# Patient Record
Sex: Female | Born: 1945
Health system: Southern US, Community
[De-identification: ages and names within clinical notes are randomized; demographics above are authoritative.]

## PROBLEM LIST (undated history)

## (undated) DIAGNOSIS — K219 Gastro-esophageal reflux disease without esophagitis: Secondary | ICD-10-CM

## (undated) DIAGNOSIS — E039 Hypothyroidism, unspecified: Secondary | ICD-10-CM

## (undated) DIAGNOSIS — E785 Hyperlipidemia, unspecified: Secondary | ICD-10-CM

## (undated) HISTORY — DX: Hyperlipidemia, unspecified: E78.5

## (undated) HISTORY — DX: Gastro-esophageal reflux disease without esophagitis: K21.9

---

## 1968-07-05 HISTORY — PX: BREAST LUMPECTOMY: SHX2

## 2000-09-04 ENCOUNTER — Encounter: Payer: Self-pay | Admitting: Emergency Medicine

## 2000-09-04 ENCOUNTER — Emergency Department (HOSPITAL_COMMUNITY): Admission: EM | Admit: 2000-09-04 | Discharge: 2000-09-04 | Payer: Self-pay | Admitting: Emergency Medicine

## 2011-05-18 ENCOUNTER — Other Ambulatory Visit (HOSPITAL_COMMUNITY): Payer: Self-pay | Admitting: Internal Medicine

## 2011-05-18 DIAGNOSIS — Z139 Encounter for screening, unspecified: Secondary | ICD-10-CM

## 2011-05-21 ENCOUNTER — Ambulatory Visit (HOSPITAL_COMMUNITY)
Admission: RE | Admit: 2011-05-21 | Discharge: 2011-05-21 | Disposition: A | Discharge status: Home / Self Care | Payer: Medicare Other | Source: Ambulatory Visit | Attending: Internal Medicine | Admitting: Internal Medicine

## 2011-05-21 DIAGNOSIS — Z139 Encounter for screening, unspecified: Secondary | ICD-10-CM

## 2011-05-21 DIAGNOSIS — Z780 Asymptomatic menopausal state: Secondary | ICD-10-CM | POA: Insufficient documentation

## 2011-05-21 DIAGNOSIS — Z13820 Encounter for screening for osteoporosis: Secondary | ICD-10-CM | POA: Insufficient documentation

## 2012-05-24 DIAGNOSIS — IMO0002 Reserved for concepts with insufficient information to code with codable children: Secondary | ICD-10-CM | POA: Diagnosis not present

## 2012-05-24 DIAGNOSIS — E039 Hypothyroidism, unspecified: Secondary | ICD-10-CM | POA: Diagnosis not present

## 2012-05-24 DIAGNOSIS — Z23 Encounter for immunization: Secondary | ICD-10-CM | POA: Diagnosis not present

## 2012-05-24 DIAGNOSIS — R7309 Other abnormal glucose: Secondary | ICD-10-CM | POA: Diagnosis not present

## 2013-05-23 DIAGNOSIS — Z23 Encounter for immunization: Secondary | ICD-10-CM | POA: Diagnosis not present

## 2013-05-23 DIAGNOSIS — IMO0002 Reserved for concepts with insufficient information to code with codable children: Secondary | ICD-10-CM | POA: Diagnosis not present

## 2013-05-23 DIAGNOSIS — E039 Hypothyroidism, unspecified: Secondary | ICD-10-CM | POA: Diagnosis not present

## 2013-05-23 DIAGNOSIS — M719 Bursopathy, unspecified: Secondary | ICD-10-CM | POA: Diagnosis not present

## 2013-05-23 DIAGNOSIS — M67919 Unspecified disorder of synovium and tendon, unspecified shoulder: Secondary | ICD-10-CM | POA: Diagnosis not present

## 2014-05-14 ENCOUNTER — Other Ambulatory Visit (HOSPITAL_COMMUNITY): Payer: Self-pay | Admitting: Family Medicine

## 2014-05-14 DIAGNOSIS — Z1231 Encounter for screening mammogram for malignant neoplasm of breast: Secondary | ICD-10-CM

## 2014-05-22 ENCOUNTER — Ambulatory Visit (HOSPITAL_COMMUNITY)
Admission: RE | Admit: 2014-05-22 | Discharge: 2014-05-22 | Disposition: A | Discharge status: Home / Self Care | Payer: Medicare Other | Source: Ambulatory Visit | Attending: Family Medicine | Admitting: Family Medicine

## 2014-05-22 DIAGNOSIS — Z1231 Encounter for screening mammogram for malignant neoplasm of breast: Secondary | ICD-10-CM | POA: Diagnosis not present

## 2014-05-28 DIAGNOSIS — Z681 Body mass index (BMI) 19 or less, adult: Secondary | ICD-10-CM | POA: Diagnosis not present

## 2014-05-28 DIAGNOSIS — E782 Mixed hyperlipidemia: Secondary | ICD-10-CM | POA: Diagnosis not present

## 2014-05-28 DIAGNOSIS — E039 Hypothyroidism, unspecified: Secondary | ICD-10-CM | POA: Diagnosis not present

## 2015-01-08 DIAGNOSIS — Z23 Encounter for immunization: Secondary | ICD-10-CM | POA: Diagnosis not present

## 2015-01-08 DIAGNOSIS — Z1389 Encounter for screening for other disorder: Secondary | ICD-10-CM | POA: Diagnosis not present

## 2015-01-08 DIAGNOSIS — E782 Mixed hyperlipidemia: Secondary | ICD-10-CM | POA: Diagnosis not present

## 2015-01-08 DIAGNOSIS — Z0000 Encounter for general adult medical examination without abnormal findings: Secondary | ICD-10-CM | POA: Diagnosis not present

## 2015-01-08 DIAGNOSIS — Z6822 Body mass index (BMI) 22.0-22.9, adult: Secondary | ICD-10-CM | POA: Diagnosis not present

## 2015-01-08 DIAGNOSIS — E039 Hypothyroidism, unspecified: Secondary | ICD-10-CM | POA: Diagnosis not present

## 2015-07-22 DIAGNOSIS — Z6823 Body mass index (BMI) 23.0-23.9, adult: Secondary | ICD-10-CM | POA: Diagnosis not present

## 2015-07-22 DIAGNOSIS — Z1389 Encounter for screening for other disorder: Secondary | ICD-10-CM | POA: Diagnosis not present

## 2015-07-22 DIAGNOSIS — E039 Hypothyroidism, unspecified: Secondary | ICD-10-CM | POA: Diagnosis not present

## 2015-09-16 ENCOUNTER — Telehealth: Payer: Self-pay

## 2015-09-16 NOTE — Telephone Encounter (Signed)
LMOM to call.

## 2015-09-16 NOTE — Telephone Encounter (Signed)
Pt received a triage letter from DS. Please call her at [phone] or [phone]

## 2015-09-17 ENCOUNTER — Telehealth: Payer: Self-pay

## 2015-09-17 NOTE — Telephone Encounter (Signed)
SUPREP SPLIT DOSING-REGULAR breakfast then CLEAR LIQUIDS after 9 am.   

## 2015-09-17 NOTE — Telephone Encounter (Signed)
See separate triage.  

## 2015-09-17 NOTE — Telephone Encounter (Signed)
Gastroenterology Pre-Procedure Review  Request Date: 09/17/2015 Requesting Physician: Dr. Ethlyn Gallery  PATIENT REVIEW QUESTIONS: The patient responded to the following health history questions as indicated:    This will be the first colonoscopy for pt  1. Diabetes Melitis: no 2. Joint replacements in the past 12 months: no 3. Major health problems in the past 3 months: no 4. Has an artificial valve or MVP: no 5. Has a defibrillator: no 6. Has been advised in past to take antibiotics in advance of a procedure like teeth cleaning: no 7. Family history of colon cancer: no  8. Alcohol Use: no 9. History of sleep apnea: no     MEDICATIONS & ALLERGIES:    Patient reports the following regarding taking any blood thinners:   Plavix? no Aspirin? no Coumadin? no  Patient confirms/reports the following medications:  Current Outpatient Prescriptions  Medication Sig Dispense Refill  . levothyroxine (SYNTHROID, LEVOTHROID) 100 MCG tablet Take 100 mcg by mouth daily before breakfast.     No current facility-administered medications for this visit.    Patient confirms/reports the following allergies:  Allergies  Allergen Reactions  . Percocet [Oxycodone-Acetaminophen] Rash    No orders of the defined types were placed in this encounter.    AUTHORIZATION INFORMATION Primary Insurance:   ID #:  Group #:  Pre-Cert / Auth required:  Pre-Cert / Auth #:   Secondary Insurance:   ID #:   Group #:  Pre-Cert / Auth required:  Pre-Cert / Auth #:   SCHEDULE INFORMATION: Procedure has been scheduled as follows:  Date: 10/24/2015               Time: 10:30 Am Location: [facility] Short Stay  This Gastroenterology Pre-Precedure Review Form is being routed to the following provider(s): Barney Drain, MD

## 2015-09-17 NOTE — Telephone Encounter (Signed)
Pt was returning DS call from yesterday to be triaged. She can be reached at [phone] ([place])

## 2015-09-18 ENCOUNTER — Other Ambulatory Visit: Payer: Self-pay

## 2015-09-18 DIAGNOSIS — Z1211 Encounter for screening for malignant neoplasm of colon: Secondary | ICD-10-CM

## 2015-09-18 MED ORDER — NA SULFATE-K SULFATE-MG SULF 17.5-3.13-1.6 GM/177ML PO SOLN: 1.0000 | ORAL | Status: DC

## 2015-09-18 NOTE — Addendum Note (Signed)
Addended by: Everardo All on: 09/18/2015 09:59 AM   Modules accepted: Orders

## 2015-09-18 NOTE — Telephone Encounter (Signed)
Rx sent to the pharmacy and instructions mailed to pt.  

## 2015-10-20 ENCOUNTER — Telehealth: Payer: Self-pay

## 2015-10-20 NOTE — Telephone Encounter (Signed)
Called pt to update med list prior to colonoscopy and she has not had any change in her meds since she was triaged.

## 2015-10-21 NOTE — Telephone Encounter (Signed)
REVIEWED-NO ADDITIONAL RECOMMENDATIONS. 

## 2015-10-24 ENCOUNTER — Encounter (HOSPITAL_COMMUNITY): Payer: Self-pay | Admitting: *Deleted

## 2015-10-24 ENCOUNTER — Ambulatory Visit (HOSPITAL_COMMUNITY)
Admission: RE | Admit: 2015-10-24 | Discharge: 2015-10-24 | Disposition: A | Discharge status: Home / Self Care | Payer: Medicare Other | Source: Ambulatory Visit | Attending: Gastroenterology | Admitting: Gastroenterology

## 2015-10-24 ENCOUNTER — Encounter (HOSPITAL_COMMUNITY)
Admission: RE | Disposition: A | Discharge status: Home / Self Care | Payer: Self-pay | Source: Ambulatory Visit | Attending: Gastroenterology

## 2015-10-24 DIAGNOSIS — D123 Benign neoplasm of transverse colon: Secondary | ICD-10-CM | POA: Diagnosis not present

## 2015-10-24 DIAGNOSIS — Q438 Other specified congenital malformations of intestine: Secondary | ICD-10-CM | POA: Insufficient documentation

## 2015-10-24 DIAGNOSIS — K5730 Diverticulosis of large intestine without perforation or abscess without bleeding: Secondary | ICD-10-CM | POA: Insufficient documentation

## 2015-10-24 DIAGNOSIS — Z79899 Other long term (current) drug therapy: Secondary | ICD-10-CM | POA: Diagnosis not present

## 2015-10-24 DIAGNOSIS — E039 Hypothyroidism, unspecified: Secondary | ICD-10-CM | POA: Diagnosis not present

## 2015-10-24 DIAGNOSIS — Z87891 Personal history of nicotine dependence: Secondary | ICD-10-CM | POA: Insufficient documentation

## 2015-10-24 DIAGNOSIS — Z1211 Encounter for screening for malignant neoplasm of colon: Secondary | ICD-10-CM

## 2015-10-24 DIAGNOSIS — K648 Other hemorrhoids: Secondary | ICD-10-CM | POA: Insufficient documentation

## 2015-10-24 HISTORY — DX: Hypothyroidism, unspecified: E03.9

## 2015-10-24 HISTORY — PX: COLONOSCOPY: SHX5424

## 2015-10-24 SURGERY — COLONOSCOPY
Anesthesia: Moderate Sedation

## 2015-10-24 MED ORDER — MIDAZOLAM HCL 5 MG/5ML IJ SOLN
INTRAMUSCULAR | Status: DC | PRN
  Administered 2015-10-24: 2 mg via INTRAVENOUS
  Administered 2015-10-24: 1 mg via INTRAVENOUS

## 2015-10-24 MED ORDER — MEPERIDINE HCL 100 MG/ML IJ SOLN
INTRAMUSCULAR | Status: AC
  Filled 2015-10-24: qty 1

## 2015-10-24 MED ORDER — SODIUM CHLORIDE 0.9 % IV SOLN
INTRAVENOUS | Status: DC
  Administered 2015-10-24: 1000 mL via INTRAVENOUS

## 2015-10-24 MED ORDER — MEPERIDINE HCL 100 MG/ML IJ SOLN
INTRAMUSCULAR | Status: DC | PRN
  Administered 2015-10-24: 25 mg via INTRAVENOUS

## 2015-10-24 MED ORDER — STERILE WATER FOR IRRIGATION IR SOLN
Status: DC | PRN
  Administered 2015-10-24: 09:00:00

## 2015-10-24 MED ORDER — MIDAZOLAM HCL 5 MG/5ML IJ SOLN
INTRAMUSCULAR | Status: AC
  Filled 2015-10-24: qty 5

## 2015-10-24 NOTE — H&P (Signed)
  Primary Care Physician:  Rocky Morel, MD Primary Gastroenterologist:  Dr. Oneida Alar  Pre-Procedure History & Physical: HPI:  Ashley Meza is a 70 y.o. female here for [employer].  Past Medical History  Diagnosis Date  . Hypothyroidism     Past Surgical History  Procedure Laterality Date  . Breast lumpectomy Left 1970    Prior to Admission medications   Medication Sig Start Date End Date Taking? Authorizing Provider  levothyroxine (SYNTHROID, LEVOTHROID) 100 MCG tablet Take 100 mcg by mouth daily before breakfast.   Yes Historical Provider, MD  Na Sulfate-K Sulfate-Mg Sulf (SUPREP BOWEL PREP) SOLN Take 1 kit by mouth as directed. 09/18/15  Yes Danie Binder, MD    Allergies as of 09/18/2015 - Review Complete 09/17/2015  Allergen Reaction Noted  . Percocet [oxycodone-acetaminophen] Rash 09/17/2015    Family History  Problem Relation Age of Onset  . Hypertension Mother     Social History   Social History  . Marital Status: Married    Spouse Name: N/A  . Number of Children: N/A  . Years of Education: N/A   Occupational History  . Not on file.   Social History Main Topics  . Smoking status: Former Smoker    Types: Cigarettes  . Smokeless tobacco: Not on file  . Alcohol Use: Yes     Comment: glass of wine every 6 months  . Drug Use: No  . Sexual Activity: Not on file   Other Topics Concern  . Not on file   Social History Narrative  . No narrative on file    Review of Systems: See HPI, otherwise negative ROS   Physical Exam: BP 117/68 mmHg  Pulse 65  Temp(Src) 97.9 F (36.6 C) (Oral)  Resp 12  Ht '5\' 5"'$  (1.651 m)  Wt 135 lb (61.236 kg)  BMI 22.47 kg/m2  SpO2 99% General:   Alert,  pleasant and cooperative in NAD Head:  Normocephalic and atraumatic. Neck:  Supple; Lungs:  Clear throughout to auscultation.    Heart:  Regular rate and rhythm. Abdomen:  Soft, nontender and nondistended. Normal bowel sounds, without guarding,  and without rebound.   Neurologic:  Alert and  oriented x4;  grossly normal neurologically.  Impression/Plan:    SCREENING  Plan:  1. TCS TODAY

## 2015-10-24 NOTE — Op Note (Signed)
[facility] Patient Name: Ashley Meza Procedure Date: 10/24/2015 7:47 AM MRN: [id number] Date of Birth: 1946-03-04 Attending MD: Barney Drain , MD CSN: [id number] Age: 70 Admit Type: Outpatient Procedure:                Colonoscopy Indications:              Screening for colorectal malignant neoplasm Providers:                Barney Drain, MD, Gwenlyn Fudge, RN, Cleda Clarks, Technologist Referring MD:             Micheline Rough, MD Medicines:                Meperidine 25 mg IV, Midazolam 3 mg IV Complications:            No immediate complications. Estimated Blood Loss:     Estimated blood loss was minimal. Procedure:                Pre-Anesthesia Assessment:                           - Prior to the procedure, a History and Physical                            was performed, and patient medications and                            allergies were reviewed. The patient's tolerance of                            previous anesthesia was also reviewed. The risks                            and benefits of the procedure and the sedation                            options and risks were discussed with the patient.                            All questions were answered, and informed consent                            was obtained. Prior Anticoagulants: The patient has                            taken no previous anticoagulant or antiplatelet                            agents. ASA Grade Assessment: I - A normal, healthy                            patient. After reviewing the risks and benefits,  the patient was deemed in satisfactory condition to                            undergo the procedure.                           After obtaining informed consent, the colonoscope                            was passed under direct vision. Throughout the                            procedure, the patient's blood pressure, pulse, and                  oxygen saturations were monitored continuously. The                            [id number] scope was introduced through                            the anus and advanced to the the cecum, identified                            by appendiceal orifice and ileocecal valve. The                            ileocecal valve, appendiceal orifice, and rectum                            were photographed. The colonoscopy was performed                            without difficulty. The patient tolerated the                            procedure well. The quality of the bowel                            preparation was excellent. Scope In: 8:58:17 AM Scope Out: 9:19:54 AM Scope Withdrawal Time: 0 hours 10 minutes 55 seconds  Total Procedure Duration: 0 hours 21 minutes 37 seconds  Findings:      The digital rectal exam was normal.      The sigmoid colon was mildly tortuous.      Many large-mouthed diverticula were found in the sigmoid colon,       transverse colon and ascending colon.      A 3 mm polyp was found in the mid transverse colon. The polyp was       sessile. The polyp was removed with a cold biopsy forceps. Resection and       retrieval were complete.      Non-bleeding internal hemorrhoids were found. The hemorrhoids were       moderate. Impression:               - Tortuous colon.                           -  Diverticulosis in the sigmoid colon, in the                            transverse colon and in the ascending colon.                           - One 3 mm polyp in the mid transverse colon,                            removed with a cold biopsy forceps. Resected and                            retrieved.                           - Non-bleeding internal hemorrhoids. Moderate Sedation:      Moderate (conscious) sedation was administered by the endoscopy nurse       and supervised by the endoscopist. The following parameters were       monitored: oxygen saturation,  heart rate, blood pressure, and response       to care. Total physician intraservice time was 30 minutes. Recommendation:           - Patient has a contact number available for                            emergencies. The signs and symptoms of potential                            delayed complications were discussed with the                            patient. Return to normal activities tomorrow.                            Written discharge instructions were provided to the                            patient.                           - High fiber diet.                           - Continue present medications.                           - Await pathology results.                           - Repeat colonoscopy in 5-10 years for surveillance                            if the benefits outweigh the risks. Procedure Code(s):        --- Professional ---  [id number], Colonoscopy, flexible; with biopsy, single                            or multiple                           99153, Moderate sedation services; each additional                            15 minutes intraservice time                           G0500, Moderate sedation services provided by the                            same physician or other qualified health care                            professional performing a gastrointestinal                            endoscopic service that sedation supports,                            requiring the presence of an independent trained                            observer to assist in the monitoring of the                            patient's level of consciousness and physiological                            status; initial 15 minutes of intra-service time;                            patient age 5 years or older (additional time may                            be reported with [phone], as appropriate) Diagnosis Code(s):        --- Professional ---                            Z12.11, Encounter for screening for malignant                            neoplasm of colon                           K64.8, Other hemorrhoids                           D12.3, Benign neoplasm of transverse colon (hepatic                            flexure or splenic  flexure)                           K57.30, Diverticulosis of large intestine without                            perforation or abscess without bleeding                           Q43.8, Other specified congenital malformations of                            intestine CPT copyright 2016 [employer]. All rights reserved. The codes documented in this report are preliminary and upon coder review may  be revised to meet current compliance requirements. Barney Drain, MD Barney Drain, MD 10/24/2015 9:52:00 AM This report has been signed electronically. Number of Addenda: 0

## 2015-10-24 NOTE — Discharge Instructions (Signed)
You have MODERATE internal hemorrhoids and diverticulosis IN YOUR LEFT AND RIGHT COLON. YOU HAD ONE SMALL POLYP REMOVED FROM YOUR TRANSVERSE(MIDDLE) COLON.   DRINK WATER TO KEEP YOUR URINE LIGHT YELLOW.  FOLLOW A HIGH FIBER DIET. AVOID ITEMS THAT CAUSE BLOATING. See info below.  YOUR BIOPSY RESULTS WILL BE AVAILABLE IN MY CHART  APR 24 AND  OR MY OFFICE WILL CONTACT YOU IN 10-14 DAYS WITH YOUR RESULTS.   Next colonoscopy in 5-10 years.  Colonoscopy Care After Read the instructions outlined below and refer to this sheet in the next week. These discharge instructions provide you with general information on caring for yourself after you leave the hospital. While your treatment has been planned according to the most current medical practices available, unavoidable complications occasionally occur. If you have any problems or questions after discharge, call DR. Dalyn Kjos, [phone].  ACTIVITY  You may resume your regular activity, but move at a slower pace for the next 24 hours.   Take frequent rest periods for the next 24 hours.   Walking will help get rid of the air and reduce the bloated feeling in your belly (abdomen).   No driving for 24 hours (because of the medicine (anesthesia) used during the test).   You may shower.   Do not sign any important legal documents or operate any machinery for 24 hours (because of the anesthesia used during the test).    NUTRITION  Drink plenty of fluids.   You may resume your normal diet as instructed by your doctor.   Begin with a light meal and progress to your normal diet. Heavy or fried foods are harder to digest and may make you feel sick to your stomach (nauseated).   Avoid alcoholic beverages for 24 hours or as instructed.    MEDICATIONS  You may resume your normal medications.   WHAT YOU CAN EXPECT TODAY  Some feelings of bloating in the abdomen.   Passage of more gas than usual.   Spotting of blood in your stool or on the  toilet paper  .  IF YOU HAD POLYPS REMOVED DURING THE COLONOSCOPY:  Eat a soft diet IF YOU HAVE NAUSEA, BLOATING, ABDOMINAL PAIN, OR VOMITING.    FINDING OUT THE RESULTS OF YOUR TEST Not all test results are available during your visit. DR. Oneida Alar WILL CALL YOU WITHIN 14 DAYS OF YOUR PROCEDUE WITH YOUR RESULTS. Do not assume everything is normal if you have not heard from DR. Chimamanda Siegfried, CALL HER OFFICE AT [phone].  SEEK IMMEDIATE MEDICAL ATTENTION AND CALL THE OFFICE: [phone] IF:  You have more than a spotting of blood in your stool.   Your belly is swollen (abdominal distention).   You are nauseated or vomiting.   You have a temperature over 101F.   You have abdominal pain or discomfort that is severe or gets worse throughout the day.  High-Fiber Diet A high-fiber diet changes your normal diet to include more whole grains, legumes, fruits, and vegetables. Changes in the diet involve replacing refined carbohydrates with unrefined foods. The calorie level of the diet is essentially unchanged. The Dietary Reference Intake (recommended amount) for adult males is 38 grams per day. For adult females, it is 25 grams per day. Pregnant and lactating women should consume 28 grams of fiber per day. Fiber is the intact part of a plant that is not broken down during digestion. Functional fiber is fiber that has been isolated from the plant to provide a beneficial effect in  the body. PURPOSE  Increase stool bulk.   Ease and regulate bowel movements.   Lower cholesterol.  REDUCE RISK OF COLON CANCER  INDICATIONS THAT YOU NEED MORE FIBER  Constipation and hemorrhoids.   Uncomplicated diverticulosis (intestine condition) and irritable bowel syndrome.   Weight management.   As a protective measure against hardening of the arteries (atherosclerosis), diabetes, and cancer.   GUIDELINES FOR INCREASING FIBER IN THE DIET  Start adding fiber to the diet slowly. A gradual increase of  about 5 more grams (2 slices of whole-wheat bread, 2 servings of most fruits or vegetables, or 1 bowl of high-fiber cereal) per day is best. Too rapid an increase in fiber may result in constipation, flatulence, and bloating.   Drink enough water and fluids to keep your urine clear or pale yellow. Water, juice, or caffeine-free drinks are recommended. Not drinking enough fluid may cause constipation.   Eat a variety of high-fiber foods rather than one type of fiber.   Try to increase your intake of fiber through using high-fiber foods rather than fiber pills or supplements that contain small amounts of fiber.   The goal is to change the types of food eaten. Do not supplement your present diet with high-fiber foods, but replace foods in your present diet.   INCLUDE A VARIETY OF FIBER SOURCES  Replace refined and processed grains with whole grains, canned fruits with fresh fruits, and incorporate other fiber sources. White rice, white breads, and most bakery goods contain little or no fiber.   Brown whole-grain rice, buckwheat oats, and many fruits and vegetables are all good sources of fiber. These include: broccoli, Brussels sprouts, cabbage, cauliflower, beets, sweet potatoes, white potatoes (skin on), carrots, tomatoes, eggplant, squash, berries, fresh fruits, and dried fruits.   Cereals appear to be the richest source of fiber. Cereal fiber is found in whole grains and bran. Bran is the fiber-rich outer coat of cereal grain, which is largely removed in refining. In whole-grain cereals, the bran remains. In breakfast cereals, the largest amount of fiber is found in those with "bran" in their names. The fiber content is sometimes indicated on the label.   You may need to include additional fruits and vegetables each day.   In baking, for 1 cup white flour, you may use the following substitutions:   1 cup whole-wheat flour minus 2 tablespoons.   1/2 cup white flour plus 1/2 cup whole-wheat  flour.   Polyps, Colon  A polyp is extra tissue that grows inside your body. Colon polyps grow in the large intestine. The large intestine, also called the colon, is part of your digestive system. It is a long, hollow tube at the end of your digestive tract where your body makes and stores stool. Most polyps are not dangerous. They are benign. This means they are not cancerous. But over time, some types of polyps can turn into cancer. Polyps that are smaller than a pea are usually not harmful. But larger polyps could someday become or may already be cancerous. To be safe, doctors remove all polyps and test them.   PREVENTION There is not one sure way to prevent polyps. You might be able to lower your risk of getting them if you:  Eat more fruits and vegetables and less fatty food.   Do not smoke.   Avoid alcohol.   Exercise every day.   Lose weight if you are overweight.   Eating more calcium and folate can also lower your risk  of getting polyps. Some foods that are rich in calcium are milk, cheese, and broccoli. Some foods that are rich in folate are chickpeas, kidney beans, and spinach.    Diverticulosis Diverticulosis is a common condition that develops when small pouches (diverticula) form in the wall of the colon. The risk of diverticulosis increases with age. It happens more often in people who eat a low-fiber diet. Most individuals with diverticulosis have no symptoms. Those individuals with symptoms usually experience belly (abdominal) pain, constipation, or loose stools (diarrhea).  HOME CARE INSTRUCTIONS  Increase the amount of fiber in your diet as directed by your caregiver or dietician. This may reduce symptoms of diverticulosis.   Drink at least 6 to 8 glasses of water each day to prevent constipation.   Try not to strain when you have a bowel movement.   Avoiding nuts and seeds to prevent complications is NOT NECESSARY.   FOODS HAVING HIGH FIBER CONTENT  INCLUDE:  Fruits. Apple, peach, pear, tangerine, raisins, prunes.   Vegetables. Brussels sprouts, asparagus, broccoli, cabbage, carrot, cauliflower, romaine lettuce, spinach, summer squash, tomato, winter squash, zucchini.   Starchy Vegetables. Baked beans, kidney beans, lima beans, split peas, lentils, potatoes (with skin).   Grains. Whole wheat bread, brown rice, bran flake cereal, plain oatmeal, white rice, shredded wheat, bran muffins.   SEEK IMMEDIATE MEDICAL CARE IF:  You develop increasing pain or severe bloating.   You have an oral temperature above 101F.   You develop vomiting or bowel movements that are bloody or black.   Hemorrhoids Hemorrhoids are dilated (enlarged) veins around the rectum. Sometimes clots will form in the veins. This makes them swollen and painful. These are called thrombosed hemorrhoids. Causes of hemorrhoids include:  Constipation.   Straining to have a bowel movement.   HEAVY LIFTING  HOME CARE INSTRUCTIONS  Eat a well balanced diet and drink 6 to 8 glasses of water every day to avoid constipation. You may also use a bulk laxative.   Avoid straining to have bowel movements.   Keep anal area dry and clean.   Do not use a donut shaped pillow or sit on the toilet for long periods. This increases blood pooling and pain.   Move your bowels when your body has the urge; this will require less straining and will decrease pain and pressure.

## 2015-10-28 ENCOUNTER — Encounter (HOSPITAL_COMMUNITY): Payer: Self-pay | Admitting: Gastroenterology

## 2015-10-31 ENCOUNTER — Telehealth: Payer: Self-pay | Admitting: Gastroenterology

## 2015-10-31 NOTE — Telephone Encounter (Signed)
Please call pt. She had a simple adenoma removed from her colon.    DRINK WATER TO KEEP YOUR URINE LIGHT YELLOW.  FOLLOW A HIGH FIBER DIET. AVOID ITEMS THAT CAUSE BLOATING.  Next colonoscopy in 5-10 years.

## 2015-11-03 ENCOUNTER — Telehealth: Payer: Self-pay | Admitting: Gastroenterology

## 2015-11-03 NOTE — Telephone Encounter (Signed)
See separate phone call.

## 2015-11-03 NOTE — Telephone Encounter (Signed)
Pt called to say that she was returning a call regarding her results, but she also said she had reviewed her results on mychart and if there was anything else the nurse needed to speak to her about she could call.

## 2015-11-03 NOTE — Telephone Encounter (Signed)
LMOM that if she saw the results from her procedure that I do not need to call her, but if she has questions for her to call me.

## 2015-11-03 NOTE — Telephone Encounter (Signed)
Reminder in epic °

## 2015-11-03 NOTE — Telephone Encounter (Signed)
Left message for pt to call.

## 2016-05-07 DIAGNOSIS — Z23 Encounter for immunization: Secondary | ICD-10-CM | POA: Diagnosis not present

## 2016-08-04 DIAGNOSIS — Z0001 Encounter for general adult medical examination with abnormal findings: Secondary | ICD-10-CM | POA: Diagnosis not present

## 2016-08-04 DIAGNOSIS — Z0000 Encounter for general adult medical examination without abnormal findings: Secondary | ICD-10-CM | POA: Diagnosis not present

## 2016-08-04 DIAGNOSIS — E039 Hypothyroidism, unspecified: Secondary | ICD-10-CM | POA: Diagnosis not present

## 2016-08-04 DIAGNOSIS — Z1389 Encounter for screening for other disorder: Secondary | ICD-10-CM | POA: Diagnosis not present

## 2016-08-04 DIAGNOSIS — E782 Mixed hyperlipidemia: Secondary | ICD-10-CM | POA: Diagnosis not present

## 2016-08-04 DIAGNOSIS — Z6823 Body mass index (BMI) 23.0-23.9, adult: Secondary | ICD-10-CM | POA: Diagnosis not present

## 2017-06-24 DIAGNOSIS — Z23 Encounter for immunization: Secondary | ICD-10-CM | POA: Diagnosis not present

## 2017-09-30 DIAGNOSIS — R739 Hyperglycemia, unspecified: Secondary | ICD-10-CM | POA: Diagnosis not present

## 2017-09-30 DIAGNOSIS — E7849 Other hyperlipidemia: Secondary | ICD-10-CM | POA: Diagnosis not present

## 2017-09-30 DIAGNOSIS — Z1389 Encounter for screening for other disorder: Secondary | ICD-10-CM | POA: Diagnosis not present

## 2017-09-30 DIAGNOSIS — R7309 Other abnormal glucose: Secondary | ICD-10-CM | POA: Diagnosis not present

## 2017-09-30 DIAGNOSIS — Z0001 Encounter for general adult medical examination with abnormal findings: Secondary | ICD-10-CM | POA: Diagnosis not present

## 2018-06-03 DIAGNOSIS — Z23 Encounter for immunization: Secondary | ICD-10-CM | POA: Diagnosis not present

## 2018-11-21 DIAGNOSIS — Z0000 Encounter for general adult medical examination without abnormal findings: Secondary | ICD-10-CM | POA: Diagnosis not present

## 2018-11-21 DIAGNOSIS — Z681 Body mass index (BMI) 19 or less, adult: Secondary | ICD-10-CM | POA: Diagnosis not present

## 2018-11-21 DIAGNOSIS — Z1389 Encounter for screening for other disorder: Secondary | ICD-10-CM | POA: Diagnosis not present

## 2019-01-01 ENCOUNTER — Other Ambulatory Visit (HOSPITAL_COMMUNITY): Payer: Self-pay | Admitting: Internal Medicine

## 2019-01-01 DIAGNOSIS — Z1231 Encounter for screening mammogram for malignant neoplasm of breast: Secondary | ICD-10-CM

## 2019-01-10 ENCOUNTER — Other Ambulatory Visit: Payer: Self-pay

## 2019-01-10 ENCOUNTER — Ambulatory Visit (HOSPITAL_COMMUNITY)
Admission: RE | Admit: 2019-01-10 | Discharge: 2019-01-10 | Disposition: A | Discharge status: Home / Self Care | Payer: Medicare Other | Source: Ambulatory Visit | Attending: Internal Medicine | Admitting: Internal Medicine

## 2019-01-10 DIAGNOSIS — Z1231 Encounter for screening mammogram for malignant neoplasm of breast: Secondary | ICD-10-CM | POA: Diagnosis not present

## 2019-01-15 ENCOUNTER — Other Ambulatory Visit (HOSPITAL_COMMUNITY): Payer: Self-pay | Admitting: Internal Medicine

## 2019-01-15 ENCOUNTER — Other Ambulatory Visit: Payer: Self-pay | Admitting: *Deleted

## 2019-01-15 DIAGNOSIS — Z20822 Contact with and (suspected) exposure to covid-19: Secondary | ICD-10-CM

## 2019-01-15 DIAGNOSIS — R928 Other abnormal and inconclusive findings on diagnostic imaging of breast: Secondary | ICD-10-CM

## 2019-01-18 DIAGNOSIS — E063 Autoimmune thyroiditis: Secondary | ICD-10-CM | POA: Diagnosis not present

## 2019-01-18 DIAGNOSIS — Z6821 Body mass index (BMI) 21.0-21.9, adult: Secondary | ICD-10-CM | POA: Diagnosis not present

## 2019-01-18 DIAGNOSIS — R0789 Other chest pain: Secondary | ICD-10-CM | POA: Diagnosis not present

## 2019-01-19 LAB — NOVEL CORONAVIRUS, NAA: SARS-CoV-2, NAA: NOT DETECTED

## 2019-01-23 ENCOUNTER — Ambulatory Visit (HOSPITAL_COMMUNITY)
Admission: RE | Admit: 2019-01-23 | Discharge: 2019-01-23 | Disposition: A | Discharge status: Home / Self Care | Payer: Medicare Other | Source: Ambulatory Visit | Attending: Internal Medicine | Admitting: Internal Medicine

## 2019-01-23 ENCOUNTER — Encounter (HOSPITAL_COMMUNITY): Payer: Self-pay

## 2019-01-23 ENCOUNTER — Other Ambulatory Visit: Payer: Self-pay

## 2019-01-23 DIAGNOSIS — R928 Other abnormal and inconclusive findings on diagnostic imaging of breast: Secondary | ICD-10-CM

## 2019-01-23 DIAGNOSIS — N6001 Solitary cyst of right breast: Secondary | ICD-10-CM | POA: Diagnosis not present

## 2019-03-30 ENCOUNTER — Ambulatory Visit (HOSPITAL_COMMUNITY)
Admission: RE | Admit: 2019-03-30 | Discharge: 2019-03-30 | Disposition: A | Discharge status: Home / Self Care | Payer: Medicare Other | Source: Ambulatory Visit | Attending: Family Medicine | Admitting: Family Medicine

## 2019-03-30 ENCOUNTER — Other Ambulatory Visit (HOSPITAL_COMMUNITY): Payer: Self-pay | Admitting: Family Medicine

## 2019-03-30 DIAGNOSIS — R0789 Other chest pain: Secondary | ICD-10-CM

## 2019-04-06 DIAGNOSIS — R05 Cough: Secondary | ICD-10-CM | POA: Diagnosis not present

## 2019-04-06 DIAGNOSIS — Z23 Encounter for immunization: Secondary | ICD-10-CM | POA: Diagnosis not present

## 2019-04-06 DIAGNOSIS — Z6821 Body mass index (BMI) 21.0-21.9, adult: Secondary | ICD-10-CM | POA: Diagnosis not present

## 2019-04-09 ENCOUNTER — Encounter: Payer: Self-pay | Admitting: Gastroenterology

## 2019-04-26 ENCOUNTER — Encounter: Payer: Self-pay | Admitting: *Deleted

## 2019-04-26 ENCOUNTER — Encounter: Payer: Self-pay | Admitting: Nurse Practitioner

## 2019-04-26 ENCOUNTER — Ambulatory Visit (INDEPENDENT_AMBULATORY_CARE_PROVIDER_SITE_OTHER): Payer: Medicare Other | Admitting: Nurse Practitioner

## 2019-04-26 ENCOUNTER — Other Ambulatory Visit: Payer: Self-pay

## 2019-04-26 ENCOUNTER — Other Ambulatory Visit: Payer: Self-pay | Admitting: *Deleted

## 2019-04-26 DIAGNOSIS — K219 Gastro-esophageal reflux disease without esophagitis: Secondary | ICD-10-CM | POA: Diagnosis not present

## 2019-04-26 MED ORDER — OMEPRAZOLE 40 MG PO CPDR: 40.0000 mg | DELAYED_RELEASE_CAPSULE | Freq: Every day | ORAL | 3 refills | Status: DC

## 2019-04-26 NOTE — Progress Notes (Addendum)
REVIEWED-NO ADDITIONAL RECOMMENDATIONS.  Primary Care Physician:  Scherrie Bateman Primary Gastroenterologist:  Dr. Oneida Alar  Chief Complaint  Patient presents with  . Gastroesophageal Reflux    chest hurting; coughing up mucous    HPI:   Ashley Meza is a 73 y.o. female who presents on referral from primary care for evaluation" coughing up mucus."  Reviewed information provided with referral including office note dated 04/14/2019.  At that time the patient complained of coughing up mucus that sometimes feels hot but improved with left parasternal pain.  Previous EKG normal.  This is been ongoing for 4 months, no nausea or vomiting or abdominal pain.  It feels to be getting worse to her.  Recommended referral to GI.  The patient last saw our practice for colonoscopy dated 10/24/2015.  Findings included tortuous colon, diverticulosis in the sigmoid colon, transverse colon, and ascending colon.  A single 3 mm polyp in the mid transverse colon was removed.  Also noted nonbleeding internal hemorrhoids.  Surgical pathology found the polyp to be tubular adenoma and recommended next colonoscopy in 5 to 10 years (2022-2027).   Today she states she's doing ok overall. For the past 4 months she has had reflux of material into her throat that is hot/burning. This gets into her throat and even her lips. Her salivary glands get hyperactive with this. She often has a "glob of stuff" she has to spit up. Associated esophageal pain as far low as her LUQ. A few times she has seen some blood-tinged mucus. Has tried Mylanta which didn't help very much; hasn't tried often. Previously had a 10 lb weight loss when her husband passed in April and she lost her appetite; weight has since stabilized with improvement in appetite. Denies other abdominal pain, N/V, hematochezia, melena, fever, chills. Denies URI or flu-like symptoms. Denies loss of sense of taste or smell.   Past Medical History:  Diagnosis Date   . Hypothyroidism     Past Surgical History:  Procedure Laterality Date  . BREAST LUMPECTOMY Left 1970  . COLONOSCOPY N/A 10/24/2015   Procedure: COLONOSCOPY;  Surgeon: Danie Binder, MD;  Location: [facility];  Service: Endoscopy;  Laterality: N/A;  10:30 Am - moved to 8:30 - Candy notified pt    Current Outpatient Medications  Medication Sig Dispense Refill  . levothyroxine (SYNTHROID, LEVOTHROID) 100 MCG tablet Take 100 mcg by mouth daily before breakfast.     No current facility-administered medications for this visit.     Allergies as of 04/26/2019 - Review Complete 04/26/2019  Allergen Reaction Noted  . Percocet [oxycodone-acetaminophen] Rash 09/17/2015    Family History  Problem Relation Age of Onset  . Hypertension Mother   . Colon cancer Neg Hx   . Gastric cancer Neg Hx   . Esophageal cancer Neg Hx     Social History   Socioeconomic History  . Marital status: Married    Spouse name: Not on file  . Number of children: Not on file  . Years of education: Not on file  . Highest education level: Not on file  Occupational History  . Not on file  Social Needs  . Financial resource strain: Not on file  . Food insecurity    Worry: Not on file    Inability: Not on file  . Transportation needs    Medical: Not on file    Non-medical: Not on file  Tobacco Use  . Smoking status: Former Smoker    Types:  Cigarettes  . Smokeless tobacco: Never Used  Substance and Sexual Activity  . Alcohol use: Yes    Comment: glass of wine every 6 months  . Drug use: No  . Sexual activity: Not on file  Lifestyle  . Physical activity    Days per week: Not on file    Minutes per session: Not on file  . Stress: Not on file  Relationships  . Social Herbalist on phone: Not on file    Gets together: Not on file    Attends religious service: Not on file    Active member of club or organization: Not on file    Attends meetings of clubs or organizations: Not on file     Relationship status: Not on file  . Intimate partner violence    Fear of current or ex partner: Not on file    Emotionally abused: Not on file    Physically abused: Not on file    Forced sexual activity: Not on file  Other Topics Concern  . Not on file  Social History Narrative  . Not on file    Review of Systems: General: Negative for anorexia, weight loss, fever, chills, fatigue, weakness. ENT: Negative for hoarseness, difficulty swallowing. CV: Negative for chest pain, angina, palpitations, peripheral edema.  Respiratory: Negative for dyspnea at rest, cough, sputum, wheezing.  GI: See history of present illness. MS: Negative for joint pain, low back pain.  Derm: Negative for rash or itching.  Endo: Negative for unusual weight change.  Heme: Negative for bruising or bleeding. Allergy: Negative for rash or hives.    Physical Exam: BP 127/73   Pulse 66   Temp (!) 96.9 F (36.1 C) (Temporal)   Ht 5\' 4"  (1.626 m)   Wt 125 lb 12.8 oz (57.1 kg)   BMI 21.59 kg/m  General:   Alert and oriented. Pleasant and cooperative. Well-nourished and well-developed.  Head:  Normocephalic and atraumatic. Eyes:  Without icterus, sclera clear and conjunctiva pink.  Ears:  Normal auditory acuity. Cardiovascular:  S1, S2 present without murmurs appreciated. Extremities without clubbing or edema. Respiratory:  Clear to auscultation bilaterally. No wheezes, rales, or rhonchi. No distress.  Gastrointestinal:  +BS, soft, non-tender and non-distended. No HSM noted. No guarding or rebound. No masses appreciated.  Rectal:  Deferred  Musculoskalatal:  Symmetrical without gross deformities. Skin:  Intact without significant lesions or rashes. Neurologic:  Alert and oriented x4;  grossly normal neurologically. Psych:  Alert and cooperative. Normal mood and affect. Heme/Lymph/Immune: No excessive bruising noted.    04/26/2019 8:53 AM   Disclaimer: This note was dictated with voice recognition  software. Similar sounding words can inadvertently be transcribed and may not be corrected upon review.

## 2019-04-26 NOTE — Assessment & Plan Note (Addendum)
Patient has symptoms very reminiscent of reflux disease including "hot, burning mucus frothy material" refluxing into her throat and mouth.  This resulted in esophageal burning, mouth burning, even "my lips get hot."  Has hypersalivation with an episode.  She always has mucus type material to cough up afterward.  When she does this she has several times (as many as 4-5) seeing a blood-tinged to this material.  She is not currently on any acid blocker.  I am going to check her for H. pylori breath testing.  I will check basic labs including CBC, CMP.  After breath testing she can start Prilosec 40 mg once daily.  Due to the blood-tinged regurgitation I will have her set up for an upper endoscopy to further evaluate.  Further recommendations to follow.  Proceed with EGD with Dr. Oneida Alar in near future: the risks, benefits, and alternatives have been discussed with the patient in detail. The patient states understanding and desires to proceed.  The patient is not on any anticoagulants, anxiolytics, chronic pain medications, antidepressants, chronic diabetes medications, or iron supplements.  Conscious sedation should be adequate for her procedure.

## 2019-04-26 NOTE — Patient Instructions (Signed)
Your health issues we discussed today were:   GERD (reflux/heartburn) with blood-tinged mucus: 1. Go to the lab to have your blood work drawn and your breath test completed 2. AFTER you complete your breath test start taking Prilosec 40 mg once a day.  Call us and let us know if you do not have any improvement after 3 to 4 weeks. 3. I have sent this to your pharmacy, take it worsening in the morning on an empty stomach 4. We will schedule an upper endoscopy to further evaluate your symptoms and the blood-tinged sputum/mucus 5. Further recommendations to follow  Overall I recommend:  1. Continue your other current medications 2. Call us if you have any questions or concerns 3. Return for follow-up in 3 months   Because of recent events of COVID-19 ("Coronavirus"), follow CDC recommendations:  Wash your hand frequently Avoid touching your face Stay away from people who are sick If you have symptoms such as fever, cough, shortness of breath then call your healthcare provider for further guidance If you are sick, STAY AT HOME unless otherwise directed by your healthcare provider. Follow directions from state and national officials regarding staying safe   At [facility] we value your feedback. You may receive a survey about your visit today. Please share your experience as we strive to create trusting relationships with our patients to provide genuine, compassionate, quality care.  We appreciate your understanding and patience as we review any laboratory studies, imaging, and other diagnostic tests that are ordered as we care for you. Our office policy is 5 business days for review of these results, and any emergent or urgent results are addressed in a timely manner for your best interest. If you do not hear from our office in 1 week, please contact us.   We also encourage the use of MyChart, which contains your medical information for your review as well. If you are not  enrolled in this feature, an access code is on this after visit summary for your convenience. Thank you for allowing [place] to be involved in your care.  It was great to see you today!  I hope you have a great Fall!!

## 2019-04-26 NOTE — Addendum Note (Signed)
Addended by: Gordy Levan, ERIC A on: 04/26/2019 09:04 AM   Modules accepted: Orders

## 2019-04-27 LAB — CBC WITH DIFFERENTIAL/PLATELET
Absolute Monocytes: 518 cells/uL (ref 200–950)
Basophils Absolute: 50 cells/uL (ref 0–200)
Basophils Relative: 0.7 %
Eosinophils Absolute: 187 cells/uL (ref 15–500)
Eosinophils Relative: 2.6 %
HCT: 40.7 % (ref 35.0–45.0)
Hemoglobin: 13.8 g/dL (ref 11.7–15.5)
Lymphs Abs: 2477 cells/uL (ref 850–3900)
MCH: 29.8 pg (ref 27.0–33.0)
MCHC: 33.9 g/dL (ref 32.0–36.0)
MCV: 87.9 fL (ref 80.0–100.0)
MPV: 10.3 fL (ref 7.5–12.5)
Monocytes Relative: 7.2 %
Neutro Abs: 3967 cells/uL (ref 1500–7800)
Neutrophils Relative %: 55.1 %
Platelets: 351 10*3/uL (ref 140–400)
RBC: 4.63 10*6/uL (ref 3.80–5.10)
RDW: 13 % (ref 11.0–15.0)
Total Lymphocyte: 34.4 %
WBC: 7.2 10*3/uL (ref 3.8–10.8)

## 2019-04-27 LAB — COMPREHENSIVE METABOLIC PANEL
AG Ratio: 1.9 (calc) (ref 1.0–2.5)
ALT: 7 U/L (ref 6–29)
AST: 14 U/L (ref 10–35)
Albumin: 4 g/dL (ref 3.6–5.1)
Alkaline phosphatase (APISO): 42 U/L (ref 37–153)
BUN: 18 mg/dL (ref 7–25)
CO2: 24 mmol/L (ref 20–32)
Calcium: 9.1 mg/dL (ref 8.6–10.4)
Chloride: 107 mmol/L (ref 98–110)
Creat: 0.73 mg/dL (ref 0.60–0.93)
Globulin: 2.1 g/dL (calc) (ref 1.9–3.7)
Glucose, Bld: 108 mg/dL (ref 65–139)
Potassium: 4.5 mmol/L (ref 3.5–5.3)
Sodium: 142 mmol/L (ref 135–146)
Total Bilirubin: 0.4 mg/dL (ref 0.2–1.2)
Total Protein: 6.1 g/dL (ref 6.1–8.1)

## 2019-04-27 LAB — H. PYLORI BREATH TEST: H. pylori Breath Test: NOT DETECTED

## 2019-07-03 ENCOUNTER — Other Ambulatory Visit (HOSPITAL_COMMUNITY)
Admission: RE | Admit: 2019-07-03 | Discharge: 2019-07-03 | Disposition: A | Discharge status: Home / Self Care | Payer: Medicare Other | Source: Ambulatory Visit | Attending: Gastroenterology | Admitting: Gastroenterology

## 2019-07-03 ENCOUNTER — Other Ambulatory Visit: Payer: Self-pay

## 2019-07-03 DIAGNOSIS — Z20828 Contact with and (suspected) exposure to other viral communicable diseases: Secondary | ICD-10-CM | POA: Insufficient documentation

## 2019-07-03 DIAGNOSIS — Z01812 Encounter for preprocedural laboratory examination: Secondary | ICD-10-CM | POA: Diagnosis present

## 2019-07-03 LAB — SARS CORONAVIRUS 2 (TAT 6-24 HRS): SARS Coronavirus 2: NEGATIVE

## 2019-07-04 ENCOUNTER — Telehealth: Payer: Self-pay

## 2019-07-04 NOTE — Telephone Encounter (Signed)
Spoke to pt, EGD for tomorrow moved up to 1:45pm, arrive at 12:45pm. Advised her NPO after 9:00am tomorrow. LMOVM for endo scheduler.

## 2019-07-05 ENCOUNTER — Ambulatory Visit (HOSPITAL_COMMUNITY)
Admission: RE | Admit: 2019-07-05 | Discharge: 2019-07-05 | Disposition: A | Discharge status: Home / Self Care | Payer: Medicare Other | Attending: Gastroenterology | Admitting: Gastroenterology

## 2019-07-05 ENCOUNTER — Other Ambulatory Visit: Payer: Self-pay

## 2019-07-05 ENCOUNTER — Encounter (HOSPITAL_COMMUNITY): Payer: Self-pay | Admitting: Gastroenterology

## 2019-07-05 ENCOUNTER — Encounter (HOSPITAL_COMMUNITY)
Admission: RE | Disposition: A | Discharge status: Home / Self Care | Payer: Self-pay | Source: Home / Self Care | Attending: Gastroenterology

## 2019-07-05 DIAGNOSIS — R1013 Epigastric pain: Secondary | ICD-10-CM | POA: Insufficient documentation

## 2019-07-05 DIAGNOSIS — Z79890 Hormone replacement therapy (postmenopausal): Secondary | ICD-10-CM | POA: Diagnosis not present

## 2019-07-05 DIAGNOSIS — K449 Diaphragmatic hernia without obstruction or gangrene: Secondary | ICD-10-CM | POA: Diagnosis not present

## 2019-07-05 DIAGNOSIS — E039 Hypothyroidism, unspecified: Secondary | ICD-10-CM | POA: Insufficient documentation

## 2019-07-05 DIAGNOSIS — K219 Gastro-esophageal reflux disease without esophagitis: Secondary | ICD-10-CM | POA: Diagnosis not present

## 2019-07-05 DIAGNOSIS — Z79899 Other long term (current) drug therapy: Secondary | ICD-10-CM | POA: Insufficient documentation

## 2019-07-05 DIAGNOSIS — K2950 Unspecified chronic gastritis without bleeding: Secondary | ICD-10-CM | POA: Diagnosis not present

## 2019-07-05 DIAGNOSIS — K2970 Gastritis, unspecified, without bleeding: Secondary | ICD-10-CM | POA: Diagnosis not present

## 2019-07-05 DIAGNOSIS — Z87891 Personal history of nicotine dependence: Secondary | ICD-10-CM | POA: Diagnosis not present

## 2019-07-05 HISTORY — PX: BIOPSY: SHX5522

## 2019-07-05 HISTORY — PX: ESOPHAGOGASTRODUODENOSCOPY: SHX5428

## 2019-07-05 SURGERY — EGD (ESOPHAGOGASTRODUODENOSCOPY)
Anesthesia: Moderate Sedation

## 2019-07-05 MED ORDER — MEPERIDINE HCL 100 MG/ML IJ SOLN
INTRAMUSCULAR | Status: AC
  Filled 2019-07-05: qty 2

## 2019-07-05 MED ORDER — MIDAZOLAM HCL 5 MG/5ML IJ SOLN
INTRAMUSCULAR | Status: DC | PRN
  Administered 2019-07-05: 1 mg via INTRAVENOUS
  Administered 2019-07-05: 2 mg via INTRAVENOUS
  Administered 2019-07-05: 1 mg via INTRAVENOUS

## 2019-07-05 MED ORDER — MEPERIDINE HCL 100 MG/ML IJ SOLN
INTRAMUSCULAR | Status: DC | PRN
  Administered 2019-07-05 (×2): 25 mg via INTRAVENOUS

## 2019-07-05 MED ORDER — LIDOCAINE VISCOUS HCL 2 % MT SOLN
OROMUCOSAL | Status: DC | PRN
  Administered 2019-07-05: 1 via OROMUCOSAL

## 2019-07-05 MED ORDER — STERILE WATER FOR IRRIGATION IR SOLN
Status: DC | PRN
  Administered 2019-07-05: 1.5 mL

## 2019-07-05 MED ORDER — MIDAZOLAM HCL 5 MG/5ML IJ SOLN
INTRAMUSCULAR | Status: AC
  Filled 2019-07-05: qty 10

## 2019-07-05 MED ORDER — LIDOCAINE VISCOUS HCL 2 % MT SOLN
OROMUCOSAL | Status: AC
  Filled 2019-07-05: qty 15

## 2019-07-05 MED ORDER — SODIUM CHLORIDE 0.9 % IV SOLN: INTRAVENOUS | Status: DC

## 2019-07-05 NOTE — Discharge Instructions (Signed)
YOUR ESOPHAGUS IS NORMAL. YOU HAVE A SMALL HIATAL HERNIA.  You have gastritis DUE TO ASPIRIN. I biopsied your stomach.   TO CONTROL HEARTBURN:    1. DRINK WATER TO KEEP YOUR URINE LIGHT YELLOW.    2.  Avoid reflux triggers. SEE INFO BELOW.    3. STRICTLY FOLLOW A LOW FAT DIET. MEATS SHOULD BE BAKED, BROILED, OR BOILED. AVOID FRIED FOOD. AVOID FAST FOOD. SEE INFO BELOW.    4. CONTINUE OMEPRAZOLE.  TAKE 30 MINUTES PRIOR TO YOUR LAST MEAL NOT AT BEDTIME.    5. USE PEPCID OR TAGAMET FOR BREAKTHROUGH HEARTBURN/REFLUX.   YOUR BIOPSY RESULTS WILL BE BACK IN 5 BUSINESS DAYS.   I AM REFERRING YOU TO SURGERY TO DISCUSS BENEFITS V. RISKS OF REFLUX SURGERY.  FOLLOW UP IN 4 MOS.       ENDOSCOPY Care After Read the instructions outlined below and refer to this sheet in the next week. These discharge instructions provide you with general information on caring for yourself after you leave the hospital. While your treatment has been planned according to the most current medical practices available, unavoidable complications occasionally occur. If you have any problems or questions after discharge, call DR. Aryav Wimberly, [phone].  ACTIVITY  You may resume your regular activity, but move at a slower pace for the next 24 hours.   Take frequent rest periods for the next 24 hours.   Walking will help get rid of the air and reduce the bloated feeling in your belly (abdomen).   No driving for 24 hours (because of the medicine (anesthesia) used during the test).   You may shower.   Do not sign any important legal documents or operate any machinery for 24 hours (because of the anesthesia used during the test).    NUTRITION  Drink plenty of fluids.   You may resume your normal diet as instructed by your doctor.   Begin with a light meal and progress to your normal diet. Heavy or fried foods are harder to digest and may make you feel sick to your stomach (nauseated).   Avoid alcoholic  beverages for 24 hours or as instructed.    MEDICATIONS  You may resume your normal medications.   WHAT YOU CAN EXPECT TODAY  Some feelings of bloating in the abdomen.   Passage of more gas than usual.   Spotting of blood in your stool or on the toilet paper  .  IF YOU HAD POLYPS REMOVED DURING THE ENDOSCOPY:  Eat a soft diet IF YOU HAVE NAUSEA, BLOATING, ABDOMINAL PAIN, OR VOMITING.    FINDING OUT THE RESULTS OF YOUR TEST Not all test results are available during your visit. DR. Oneida Alar WILL CALL YOU WITHIN 7 DAYS OF YOUR PROCEDUE WITH YOUR RESULTS. Do not assume everything is normal if you have not heard from DR. Jilleen Essner IN ONE WEEK, CALL HER OFFICE AT [phone].  SEEK IMMEDIATE MEDICAL ATTENTION AND CALL THE OFFICE: [phone] IF:  You have more than a spotting of blood in your stool.   Your belly is swollen (abdominal distention).   You are nauseated or vomiting.   You have a temperature over 101F.   You have abdominal pain or discomfort that is severe or gets worse throughout the day.   Lifestyle and home remedies TO CONTROL HEARTBURN You may eliminate or reduce the frequency of heartburn by making the following lifestyle changes:  Control your weight. Being overweight is a major risk factor for heartburn and GERD. Excess pounds  put pressure on your abdomen, pushing up your stomach and causing acid to back up into your esophagus.   Eat smaller meals. 4 TO 6 MEALS A DAY. This reduces pressure on the lower esophageal sphincter, helping to prevent the valve from opening and acid from washing back into your esophagus.   Loosen your belt. Clothes that fit tightly around your waist put pressure on your abdomen and the lower esophageal sphincter.   Eliminate heartburn triggers. Everyone has specific triggers. Common triggers such as fatty or fried foods, spicy food, tomato sauce, carbonated beverages, alcohol, chocolate, mint, garlic, onion, caffeine and nicotine may  make heartburn worse.   Avoid stooping or bending. Tying your shoes is OK. Bending over for longer periods to weed your garden isn't, especially soon after eating.   Don't lie down after a meal. Wait at least three to four hours after eating before going to bed, and don't lie down right after eating.   SLEEP WITH ON A WEDGE OR PUT THE HEAD OF YOUR BED ON 6 INCH BLOCKS TO KEEP YOUR HEAD ABOVE YOUR HEART WHILE YOU SLEEP.   Alternative medicine Several home remedies exist for treating GERD, but they provide only temporary relief. They include drinking baking soda (sodium bicarbonate) added to water or drinking other fluids such as baking soda mixed with cream of tartar and water. Although these liquids create temporary relief by neutralizing, washing away or buffering acids, eventually they aggravate the situation by adding gas and fluid to your stomach, increasing pressure and causing more acid reflux. Further, adding more sodium to your diet may increase your blood pressure and add stress to your heart, and excessive bicarbonate ingestion can alter the acid-base balance in your body.   BREADS, CEREALS, PASTA, RICE, DRIED PEAS, AND BEANS These products are high in carbohydrates and most are low in fat. Therefore, they can be increased in the diet as substitutes for fatty foods. They too, however, contain calories and should not be eaten in excess. Cereals can be eaten for snacks as well as for breakfast.  Include foods that contain fiber (fruits, vegetables, whole grains, and legumes). Research shows that fiber may lower blood cholesterol levels, especially the water-soluble fiber found in fruits, vegetables, oat products, and legumes.  FRUITS AND VEGETABLES It is good to eat fruits and vegetables. Besides being sources of fiber, both are rich in vitamins and some minerals. They help you get the daily allowances of these nutrients. Fruits and vegetables can be used for snacks and  desserts.  MEATS Limit lean meat, chicken, [place], and fish to no more than 6 ounces per day.  Beef, Pork, and Lamb Use lean cuts of beef, pork, and lamb. Lean cuts include:  Extra-lean ground beef.  Arm roast.  Sirloin tip.  Center-cut ham.  Round steak.  Loin chops.  Rump roast.  Tenderloin.  Trim all fat off the outside of meats before cooking. It is not necessary to severely decrease the intake of red meat, but lean choices should be made. Lean meat is rich in protein and contains a highly absorbable form of iron. Premenopausal women, in particular, should avoid reducing lean red meat because this could increase the risk for low red blood cells (iron-deficiency anemia).  Chicken and [place] These are good sources of protein. The fat of poultry can be reduced by removing the skin and underlying fat layers before cooking. Chicken and [place] can be substituted for lean red meat in the diet. Poultry should not be  fried or covered with high-fat sauces.  Fish and Shellfish Fish is a good source of protein. Shellfish contain cholesterol, but they usually are low in saturated fatty acids. The preparation of fish is important. Like chicken and [place], they should not be fried or covered with high-fat sauces.  EGGS Egg whites contain no fat or cholesterol. They can be eaten often. Try 1 to 2 egg whites instead of whole eggs in recipes or use egg substitutes that do not contain yolk.  MILK AND DAIRY PRODUCTS Use skim or 1% milk instead of 2% or whole milk. Decrease whole milk, natural, and processed cheeses. Use nonfat or low-fat (2%) cottage cheese or low-fat cheeses made from vegetable oils. Choose nonfat or low-fat (1 to 2%) yogurt. Experiment with evaporated skim milk in recipes that call for heavy cream. Substitute low-fat yogurt or low-fat cottage cheese for sour cream in dips and salad dressings. Have at least 2 servings of low-fat dairy products, such as 2 glasses of skim (or 1%) milk each  day to help get your daily calcium intake.  FATS AND OILS Reduce the total intake of fats, especially saturated fat. Butterfat, lard, and beef fats are high in saturated fat and cholesterol. These should be avoided as much as possible. Vegetable fats do not contain cholesterol, but certain vegetable fats, such as coconut oil, palm oil, and palm kernel oil are very high in saturated fats. These should be limited. These fats are often used in bakery goods, processed foods, popcorn, oils, and nondairy creamers. Vegetable shortenings and some peanut butters contain hydrogenated oils, which are also saturated fats. Read the labels on these foods and check for saturated vegetable oils.  Unsaturated vegetable oils and fats do not raise blood cholesterol. However, they should be limited because they are fats and are high in calories. Total fat should still be limited to 30% of your daily caloric intake. Desirable liquid vegetable oils are corn oil, cottonseed oil, olive oil, canola oil, safflower oil, soybean oil, and sunflower oil. Peanut oil is not as good, but small amounts are acceptable. Buy a heart-healthy tub margarine that has no partially hydrogenated oils in the ingredients. Mayonnaise and salad dressings often are made from unsaturated fats, but they should also be limited because of their high calorie and fat content.  OTHER EATING TIPS Snacks  Most sweets should be limited as snacks. They tend to be rich in calories and fats, and their caloric content outweighs their nutritional value. Some good choices in snacks are graham crackers, melba toast, soda crackers, bagels (no egg), English muffins, fruits, and vegetables. These snacks are preferable to snack crackers, [place] fries, and chips. Popcorn should be air-popped or cooked in small amounts of liquid vegetable oil.  Desserts Eat fruit, low-fat yogurt, and fruit ices instead of pastries, cake, and cookies. Sherbet, angel food cake, gelatin  dessert, frozen low-fat yogurt, or other frozen products that do not contain saturated fat (pure fruit juice bars, frozen ice pops) are also acceptable.   COOKING METHODS Choose those methods that use little or no fat. They include: Poaching.  Braising.  Steaming.  Grilling.  Baking.  Stir-frying.  Broiling.  Microwaving.  Foods can be cooked in a nonstick pan without added fat, or use a nonfat cooking spray in regular cookware. Limit fried foods and avoid frying in saturated fat. Add moisture to lean meats by using water, broth, cooking wines, and other nonfat or low-fat sauces along with the cooking methods mentioned above. Soups and  stews should be chilled after cooking. The fat that forms on top after a few hours in the refrigerator should be skimmed off. When preparing meals, avoid using excess salt. Salt can contribute to raising blood pressure in some people . EATING AWAY FROM HOME Order entres, potatoes, and vegetables without sauces or butter. When meat exceeds the size of a deck of cards (3 to 4 ounces), the rest can be taken home for another meal.  Choose vegetable or fruit salads and ask for low-calorie salad dressings to be served on the side. Use dressings sparingly. Limit high-fat toppings, such as bacon, crumbled eggs, cheese, sunflower seeds, and olives. Ask for heart-healthy tub margarine instead of butter.

## 2019-07-05 NOTE — H&P (Signed)
Primary Care Physician:  Scherrie Bateman Primary Gastroenterologist:  Dr. Oneida Alar  Pre-Procedure History & Physical: HPI:  Ashley Meza is a 73 y.o. female here for DYSPEPSIA.  Past Medical History:  Diagnosis Date  . Hypothyroidism     Past Surgical History:  Procedure Laterality Date  . BREAST LUMPECTOMY Left 1970  . COLONOSCOPY N/A 10/24/2015   Procedure: COLONOSCOPY;  Surgeon: Danie Binder, MD;  [facility];  Service: Endoscopy;  Laterality: N/A;  10:30 Am - moved to 8:30 - Candy notified pt    Prior to Admission medications   Medication Sig Start Date End Date Taking? Authorizing Provider  aspirin 325 MG tablet Take 650 mg by mouth every 6 (six) hours as needed (pain/headaches.).   Yes [provider]  EUTHYROX 88 MCG tablet Take 88 mcg by mouth daily before breakfast. 04/04/19  Yes [provider]  omeprazole (PRILOSEC) 40 MG capsule Take 1 capsule (40 mg total) by mouth daily. Patient taking differently: Take 40 mg by mouth at bedtime.  04/26/19  Yes Carlis Stable, NP    Allergies as of 04/26/2019 - Review Complete 04/26/2019  Allergen Reaction Noted  . Percocet [oxycodone-acetaminophen] Rash 09/17/2015    Family History  Problem Relation Age of Onset  . Hypertension Mother   . Colon cancer Neg Hx   . Gastric cancer Neg Hx   . Esophageal cancer Neg Hx     Social History   Socioeconomic History  . Marital status: Married    Spouse name: Not on file  . Number of children: Not on file  . Years of education: Not on file  . Highest education level: Not on file  Occupational History  . Not on file  Tobacco Use  . Smoking status: Former Smoker    Types: Cigarettes  . Smokeless tobacco: Never Used  . Tobacco comment: Quit around 2010  Substance and Sexual Activity  . Alcohol use: Yes    Comment: glass of wine every 6 months  . Drug use: No  . Sexual activity: Not on file  Other Topics Concern  . Not on file   Social History Narrative  . Not on file   Social Determinants of Health   Financial Resource Strain:   . Difficulty of Paying Living Expenses: Not on file  Food Insecurity:   . Worried About Charity fundraiser in the Last Year: Not on file  . Ran Out of Food in the Last Year: Not on file  Transportation Needs:   . Lack of Transportation (Medical): Not on file  . Lack of Transportation (Non-Medical): Not on file  Physical Activity:   . Days of Exercise per Week: Not on file  . Minutes of Exercise per Session: Not on file  Stress:   . Feeling of Stress : Not on file  Social Connections:   . Frequency of Communication with Friends and Family: Not on file  . Frequency of Social Gatherings with Friends and Family: Not on file  . Attends Religious Services: Not on file  . Active Member of Clubs or Organizations: Not on file  . Attends Archivist Meetings: Not on file  . Marital Status: Not on file  Intimate Partner Violence:   . Fear of Current or Ex-Partner: Not on file  . Emotionally Abused: Not on file  . Physically Abused: Not on file  . Sexually Abused: Not on file    Review of Systems: See HPI, otherwise  negative ROS   Physical Exam: BP (!) 150/71   Pulse 62   Temp 98.3 F (36.8 C) (Oral)   Resp 16   Ht 5\' 4"  (1.626 m)   Wt 56.7 kg   SpO2 100%   BMI 21.46 kg/m  General:   Alert,  pleasant and cooperative in NAD Head:  Normocephalic and atraumatic. Neck:  Supple; Lungs:  Clear throughout to auscultation.    Heart:  Regular rate and rhythm. Abdomen:  Soft, nontender and nondistended. Normal bowel sounds, without guarding, and without rebound.   Neurologic:  Alert and  oriented x4;  grossly normal neurologically.  Impression/Plan:     DYSPEPSIA  PLAN:  EGD TODAY.  DISCUSSED PROCEDURE, BENEFITS, & RISKS: < 1% chance of medication reaction, bleeding, perforation, or ASPIRATION.

## 2019-07-05 NOTE — Op Note (Signed)
[facility] Patient Name: Ashley Meza Procedure Date: 07/05/2019 2:10 PM MRN: [id number] Date of Birth: 24-Apr-1946 Attending MD: Barney Drain MD, MD CSN: [id number] Age: 73 Admit Type: Outpatient Procedure:                Upper GI endoscopy WITH COLD FORCEPS BIOPSY Indications:              Dyspepsia Providers:                Barney Drain MD, MD, Charlsie Quest. Theda Sers RN, RN,                            Nelma Rothman, Technician Referring MD:             Jake Samples PA Medicines:                Meperidine 50 mg IV, Midazolam 4 mg IV Complications:            No immediate complications. Estimated Blood Loss:     Estimated blood loss was minimal. Procedure:                Pre-Anesthesia Assessment:                           - Prior to the procedure, a History and Physical                            was performed, and patient medications and                            allergies were reviewed. The patient's tolerance of                            previous anesthesia was also reviewed. The risks                            and benefits of the procedure and the sedation                            options and risks were discussed with the patient.                            All questions were answered, and informed consent                            was obtained. Prior Anticoagulants: The patient has                            taken no previous anticoagulant or antiplatelet                            agents except for aspirin. ASA Grade Assessment: II                            - A patient with mild systemic disease. After  reviewing the risks and benefits, the patient was                            deemed in satisfactory condition to undergo the                            procedure. After obtaining informed consent, the                            endoscope was passed under direct vision.                            Throughout the procedure, the patient's  blood                            pressure, pulse, and oxygen saturations were                            monitored continuously. The [id number]                            scope was introduced through the mouth, and                            advanced to the second part of duodenum. The upper                            GI endoscopy was accomplished without difficulty.                            The patient tolerated the procedure well. Scope In: 2:46:29 PM Scope Out: 2:52:12 PM Total Procedure Duration: 0 hours 5 minutes 43 seconds  Findings:      The examined esophagus was normal.      A small hiatal hernia was present.      Patchy mild inflammation characterized by congestion (edema), erosions       and erythema was found in the gastric fundus, in the gastric body and in       the gastric antrum. Biopsies(2;BODY,1:INCISURA,2:ANTRUM) were taken with       a cold forceps for Helicobacter pylori testing.      The examined duodenum was normal. Impression:               - NO SOURCE FOR DYSPEPSIA IDENTIFIED AND SYMPTOMS                            ARE MOST LILLEY DUE TO UNCONTROLLED GERD. SYMPTOMS                            ARE NOT IDEALLY CONTROLLED WITH MEDICAL MANAGEMENT.                           - Small hiatal hernia.                           -  MILD Gastritis. Biopsied. Moderate Sedation:      Moderate (conscious) sedation was administered by the endoscopy nurse       and supervised by the endoscopist. The following parameters were       monitored: oxygen saturation, heart rate, blood pressure, and response       to care. Total physician intraservice time was 19 minutes. Recommendation:           - Patient has a contact number available for                            emergencies. The signs and symptoms of potential                            delayed complications were discussed with the                            patient. Return to normal activities tomorrow.                             Written discharge instructions were provided to the                            patient.                           - Low fat diet.                           - Continue present medications.                           - Await pathology results.                           - Return to GI office in 4 months.                           - Refer to a surgeon TO NISSEN FUNDOPOLICATION at                            the next available appointment. Procedure Code(s):        --- Professional ---                           [phone], Esophagogastroduodenoscopy, flexible,                            transoral; with biopsy, single or multiple                           G0500, Moderate sedation services provided by the                            same physician or other qualified health care  professional performing a gastrointestinal                            endoscopic service that sedation supports,                            requiring the presence of an independent trained                            observer to assist in the monitoring of the                            patient's level of consciousness and physiological                            status; initial 15 minutes of intra-service time;                            patient age 34 years or older (additional time may                            be reported with [phone], as appropriate) Diagnosis Code(s):        --- Professional ---                           K44.9, Diaphragmatic hernia without obstruction or                            gangrene                           K29.70, Gastritis, unspecified, without bleeding                           R10.13, Epigastric pain CPT copyright 2019 American Medical Association. All rights reserved. The codes documented in this report are preliminary and upon coder review may  be revised to meet current compliance requirements. Barney Drain, MD Barney Drain MD, MD 07/05/2019 3:14:56  PM This report has been signed electronically. Number of Addenda: 0

## 2019-07-10 LAB — SURGICAL PATHOLOGY

## 2019-07-11 DIAGNOSIS — E870 Hyperosmolality and hypernatremia: Secondary | ICD-10-CM | POA: Diagnosis not present

## 2019-07-11 DIAGNOSIS — E063 Autoimmune thyroiditis: Secondary | ICD-10-CM | POA: Diagnosis not present

## 2019-07-31 ENCOUNTER — Other Ambulatory Visit: Payer: Self-pay

## 2019-07-31 DIAGNOSIS — K219 Gastro-esophageal reflux disease without esophagitis: Secondary | ICD-10-CM

## 2019-08-01 MED ORDER — OMEPRAZOLE 40 MG PO CPDR: 40.0000 mg | DELAYED_RELEASE_CAPSULE | Freq: Every day | ORAL | 3 refills | Status: DC

## 2019-08-02 ENCOUNTER — Ambulatory Visit: Payer: Medicare Other | Admitting: Nurse Practitioner

## 2019-08-17 DIAGNOSIS — Z23 Encounter for immunization: Secondary | ICD-10-CM | POA: Diagnosis not present

## 2019-09-14 DIAGNOSIS — Z23 Encounter for immunization: Secondary | ICD-10-CM | POA: Diagnosis not present

## 2019-11-13 ENCOUNTER — Ambulatory Visit (INDEPENDENT_AMBULATORY_CARE_PROVIDER_SITE_OTHER): Payer: Medicare Other | Admitting: Nurse Practitioner

## 2019-11-13 ENCOUNTER — Encounter: Payer: Self-pay | Admitting: Gastroenterology

## 2019-11-13 ENCOUNTER — Encounter: Payer: Self-pay | Admitting: Nurse Practitioner

## 2019-11-13 ENCOUNTER — Other Ambulatory Visit: Payer: Self-pay

## 2019-11-13 DIAGNOSIS — K219 Gastro-esophageal reflux disease without esophagitis: Secondary | ICD-10-CM | POA: Diagnosis not present

## 2019-11-13 DIAGNOSIS — K449 Diaphragmatic hernia without obstruction or gangrene: Secondary | ICD-10-CM | POA: Diagnosis not present

## 2019-11-13 NOTE — Progress Notes (Signed)
Referring Provider: Jake Samples, PA* Primary Care Physician:  Jake Samples, PA-C Primary GI:  Dr. Gala Romney in Dr. Oneida Alar absence; pending Dr. Abbey Chatters  NOTE: Service was provided via telemedicine and was requested by the patient due to COVID-19 pandemic.  Patient Location: Home  Provider Location: [place] office  Reason for Phone Visit: Follow-up  Persons present on the phone encounter, with roles: Patient, myself (provider),Mindy Estudillo (updated meds and allergies)  Total time (minutes) spent on medical discussion: 14 minutes  Due to COVID-19, visit was conducted using telephonic method (no video was available).  Visit was requested by patient.  Virtual Visit via Telephone only  I connected with Ashley Meza on 11/13/19 at  1:30 PM EDT by Telephone and verified that I am speaking with the correct person using two identifiers.   I discussed the limitations, risks, security and privacy concerns of performing an evaluation and management service by telephone and the availability of in person appointments. I also discussed with the patient that there may be a patient responsible charge related to this service. The patient expressed understanding and agreed to proceed.  Chief Complaint  Patient presents with  . Gastroesophageal Reflux    f/u. Minor issues right now    HPI:   Ashley Meza is a 74 y.o. female who presents for virtual visit regarding: GERD.  Patient seen office 04/26/2019 for same.  At that time, noted history of coughing up significant mucus that sometimes "feels".  EKG previously normal.  Ongoing for more months.  Colonoscopy up-to-date 2017 with diverticulosis and a single tubular adenoma colon polyp, nonbleeding internal hemorrhoids.  Recommended 5 to 10-year repeat (2022-2027).  At her last visit she noted 4 months of refluxing material into her throat, associated with excessive salivation and needing to spit out a "glob of stuff".  Noted  esophageal pain as far low as her left upper quadrant.  Has tried Mylanta that did not help.  Someone blood-tinged mucus.  Previous weight loss of 10 pounds likely grief related with the passing of her husband but weight has since stabilized.  No other overt GI complaints.  Recommended H. pylori breath testing, after breath testing start Prilosec 40 mg daily, EGD, call with progress report in 3 to 4 weeks after starting PPI, follow-up in 3 months.  H. pylori breath testing was negative.  EGD completed 07/05/2019 which found no obvious source for dyspepsia identified and most likely due to uncontrolled GERD, not ideally controlled.  Small hiatal hernia, mild gastritis status post biopsy.  Surgical pathology found the biopsies to be mild chronic gastritis with reactive changes without increase in eosinophils, intestinal metaplasia, dysplasia, malignancy.  Recommended referral to surgeon for Nissen fundoplication consideration at next available appointment.  Follow-up in 4 months.  Today she states she's doing ok overall. GERD doing much better on Omeprazole 40 mg daily. Previously with chest pain which has since improved. Rare to no breakthrough on PPI. Denies N/V, hematochezia, melena, fever, chills, unintentional weight loss. Denies URI or flu-like symptoms. Denies loss of sense of taste or smell. The patient has received COVID-19 vaccination(s). Denies chest pain, dyspnea, dizziness, lightheadedness, syncope, near syncope. Denies any other upper or lower GI symptoms.  She is wanting to hold off on hiatal hernia repair for now, because she's doing well.  Past Medical History:  Diagnosis Date  . Hypothyroidism     Past Surgical History:  Procedure Laterality Date  . BIOPSY  07/05/2019   Procedure: BIOPSY;  Surgeon: Danie Binder, MD;  Location: [facility];  Service: Endoscopy;;  . BREAST LUMPECTOMY Left 1970  . COLONOSCOPY N/A 10/24/2015   Procedure: COLONOSCOPY;  Surgeon: Danie Binder, MD;   Location: [facility];  Service: Endoscopy;  Laterality: N/A;  10:30 Am - moved to 8:30 - Candy notified pt  . ESOPHAGOGASTRODUODENOSCOPY N/A 07/05/2019   Procedure: ESOPHAGOGASTRODUODENOSCOPY (EGD);  Surgeon: Danie Binder, MD;  Location: [facility];  Service: Endoscopy;  Laterality: N/A;  2:45pm - patient's ride not coming until 1:00, will try to get here a little early    Current Outpatient Medications  Medication Sig Dispense Refill  . aspirin 325 MG tablet Take 650 mg by mouth every 6 (six) hours as needed (pain/headaches.). About once a month    . EUTHYROX 88 MCG tablet Take 88 mcg by mouth daily before breakfast.    . omeprazole (PRILOSEC) 40 MG capsule Take 1 capsule (40 mg total) by mouth daily. 90 capsule 3   No current facility-administered medications for this visit.    Allergies as of 11/13/2019 - Review Complete 11/13/2019  Allergen Reaction Noted  . Percocet [oxycodone-acetaminophen] Rash 09/17/2015    Family History  Problem Relation Age of Onset  . Hypertension Mother   . Colon cancer Neg Hx   . Gastric cancer Neg Hx   . Esophageal cancer Neg Hx     Social History   Socioeconomic History  . Marital status: Married    Spouse name: Not on file  . Number of children: Not on file  . Years of education: Not on file  . Highest education level: Not on file  Occupational History  . Not on file  Tobacco Use  . Smoking status: Former Smoker    Types: Cigarettes  . Smokeless tobacco: Never Used  . Tobacco comment: Quit around 2010  Substance and Sexual Activity  . Alcohol use: Yes    Comment: glass of wine every 6 months  . Drug use: No  . Sexual activity: Not on file  Other Topics Concern  . Not on file  Social History Narrative  . Not on file   Social Determinants of Health   Financial Resource Strain:   . Difficulty of Paying Living Expenses:   Food Insecurity:   . Worried About Charity fundraiser in the Last Year:   . Arboriculturist in the  Last Year:   Transportation Needs:   . Film/video editor (Medical):   Marland Kitchen Lack of Transportation (Non-Medical):   Physical Activity:   . Days of Exercise per Week:   . Minutes of Exercise per Session:   Stress:   . Feeling of Stress :   Social Connections:   . Frequency of Communication with Friends and Family:   . Frequency of Social Gatherings with Friends and Family:   . Attends Religious Services:   . Active Member of Clubs or Organizations:   . Attends Archivist Meetings:   Marland Kitchen Marital Status:     Review of Systems: Review of Systems  Constitutional: Negative for chills, fever, malaise/fatigue and weight loss.  HENT: Negative for congestion and sore throat.   Respiratory: Negative for cough and shortness of breath.   Cardiovascular: Negative for chest pain.  Gastrointestinal: Negative for abdominal pain, blood in stool, melena, nausea and vomiting.  Neurological: Negative for dizziness and weakness.  Psychiatric/Behavioral: Negative for depression. The patient is not nervous/anxious.   All other systems reviewed and  are negative.   Physical Exam: Note: limited exam due to virtual visit There were no vitals taken for this visit. Physical Exam Nursing note reviewed.  Constitutional:      General: She is not in acute distress.    Appearance: She is well-developed.     Comments: No distress apparent on telephone  HENT:     Nose: No congestion.     Comments: No congestion audible on phone visit Pulmonary:     Effort: No respiratory distress.     Comments: Conversational without respiratory distress heard during phone visit Abdominal:     Palpations: There is no hepatomegaly or splenomegaly.  Skin:    Coloration: Skin is not jaundiced.     Findings: No rash.  Neurological:     General: No focal deficit present.     Mental Status: She is alert and oriented to person, place, and time.     Comments: No obvious speech deficit heard on phone visit    Psychiatric:        Attention and Perception: Attention normal.        Mood and Affect: Mood normal.        Speech: Speech normal.        Behavior: Behavior normal.        Thought Content: Thought content normal.        Cognition and Memory: Cognition and memory normal.

## 2019-11-13 NOTE — Assessment & Plan Note (Signed)
GERD symptoms doing quite well on Prilosec 40 mg daily.  Rare to no breakthrough symptoms.  She feels her worsening symptoms previously were likely due to grief process with the recent passing of her husband as well as the COVID-19/coronavirus pandemic.  Recommend she continue her current medications, follow-up in 6 months.  Call for any worsening symptoms.

## 2019-11-13 NOTE — Patient Instructions (Signed)
Your health issues we discussed today were:   GERD Ashley Meza) with chest discomfort: 1. I am glad you are doing better! 2. Continue taking your current medications including omeprazole (Prilosec) 40 mg once daily 3. Call us if you have any worsening or severe symptoms  Overall I recommend:  1. Continue your other current medications 2. Return for follow-up in 6 months 3. Call us for any questions or concerns.   ---------------------------------------------------------------  I am glad you have gotten your COVID-19 vaccination!  Even though you are fully vaccinated you should continue to wear a mask, socially distance, and wash your hands frequently.  ---------------------------------------------------------------   At [facility] we value your feedback. You may receive a survey about your visit today. Please share your experience as we strive to create trusting relationships with our patients to provide genuine, compassionate, quality care.  We appreciate your understanding and patience as we review any laboratory studies, imaging, and other diagnostic tests that are ordered as we care for you. Our office policy is 5 business days for review of these results, and any emergent or urgent results are addressed in a timely manner for your best interest. If you do not hear from our office in 1 week, please contact us.   We also encourage the use of MyChart, which contains your medical information for your review as well. If you are not enrolled in this feature, an access code is on this after visit summary for your convenience. Thank you for allowing [place] to be involved in your care.  It was great to see you today!  I hope you have a great Summer!!

## 2019-11-13 NOTE — Assessment & Plan Note (Signed)
Previously noted hiatal hernia and with her progressive GERD symptoms recommended consideration of possible Nissen fundoplication.  At this point given her symptoms doing much better, she is wanting to hold off on this.  I agree that surgical repair may be a bit much given her asymptomatic state on PPI.  We could refer her if her symptoms do become exacerbated again.  Follow-up in 6 months.  Call for any worsening or severe symptoms.

## 2019-11-28 ENCOUNTER — Telehealth: Payer: Self-pay | Admitting: *Deleted

## 2019-11-28 NOTE — Telephone Encounter (Signed)
Ashley Meza, you are scheduled for a virtual visit with your provider today.  Just as we do with appointments in the office, we must obtain your consent to participate.  Your consent will be active for this visit and any virtual visit you may have with one of our providers in the next 365 days.  If you have a MyChart account, I can also send a copy of this consent to you electronically.  All virtual visits are billed to your insurance company just like a traditional visit in the office.  As this is a virtual visit, video technology does not allow for your provider to perform a traditional examination.  This may limit your provider's ability to fully assess your condition.  If your provider identifies any concerns that need to be evaluated in person or the need to arrange testing such as labs, EKG, etc, we will make arrangements to do so.  Although advances in technology are sophisticated, we cannot ensure that it will always work on either your end or our end.  If the connection with a video visit is poor, we may have to switch to a telephone visit.  With either a video or telephone visit, we are not always able to ensure that we have a secure connection.   I need to obtain your verbal consent now.   Are you willing to proceed with your visit today?

## 2019-11-28 NOTE — Telephone Encounter (Signed)
Pt consented to a telephone visit on 11/13/19. 

## 2020-02-22 DIAGNOSIS — Z0001 Encounter for general adult medical examination with abnormal findings: Secondary | ICD-10-CM | POA: Diagnosis not present

## 2020-02-22 DIAGNOSIS — Z23 Encounter for immunization: Secondary | ICD-10-CM | POA: Diagnosis not present

## 2020-02-22 DIAGNOSIS — E063 Autoimmune thyroiditis: Secondary | ICD-10-CM | POA: Diagnosis not present

## 2020-02-22 DIAGNOSIS — Z6821 Body mass index (BMI) 21.0-21.9, adult: Secondary | ICD-10-CM | POA: Diagnosis not present

## 2020-02-22 DIAGNOSIS — Z1389 Encounter for screening for other disorder: Secondary | ICD-10-CM | POA: Diagnosis not present

## 2020-02-22 DIAGNOSIS — E7849 Other hyperlipidemia: Secondary | ICD-10-CM | POA: Diagnosis not present

## 2020-04-22 ENCOUNTER — Other Ambulatory Visit (HOSPITAL_COMMUNITY): Payer: Self-pay | Admitting: Internal Medicine

## 2020-04-22 DIAGNOSIS — Z1231 Encounter for screening mammogram for malignant neoplasm of breast: Secondary | ICD-10-CM

## 2020-05-05 DIAGNOSIS — Z23 Encounter for immunization: Secondary | ICD-10-CM | POA: Diagnosis not present

## 2020-05-08 ENCOUNTER — Ambulatory Visit (HOSPITAL_COMMUNITY)
Admission: RE | Admit: 2020-05-08 | Discharge: 2020-05-08 | Disposition: A | Discharge status: Home / Self Care | Payer: Medicare Other | Source: Ambulatory Visit | Attending: Internal Medicine | Admitting: Internal Medicine

## 2020-05-08 ENCOUNTER — Other Ambulatory Visit: Payer: Self-pay

## 2020-05-08 DIAGNOSIS — Z1231 Encounter for screening mammogram for malignant neoplasm of breast: Secondary | ICD-10-CM

## 2020-05-15 ENCOUNTER — Ambulatory Visit (INDEPENDENT_AMBULATORY_CARE_PROVIDER_SITE_OTHER): Payer: Medicare Other | Admitting: Nurse Practitioner

## 2020-05-15 ENCOUNTER — Encounter: Payer: Self-pay | Admitting: Internal Medicine

## 2020-05-15 ENCOUNTER — Other Ambulatory Visit: Payer: Self-pay

## 2020-05-15 ENCOUNTER — Encounter: Payer: Self-pay | Admitting: Nurse Practitioner

## 2020-05-15 VITALS — BP 136/69 | HR 65 | Temp 97.1°F | Ht 64.0 in | Wt 130.6 lb

## 2020-05-15 DIAGNOSIS — T781XXA Other adverse food reactions, not elsewhere classified, initial encounter: Secondary | ICD-10-CM

## 2020-05-15 DIAGNOSIS — K219 Gastro-esophageal reflux disease without esophagitis: Secondary | ICD-10-CM | POA: Diagnosis not present

## 2020-05-15 DIAGNOSIS — K449 Diaphragmatic hernia without obstruction or gangrene: Secondary | ICD-10-CM

## 2020-05-15 NOTE — Patient Instructions (Signed)
Your health issues we discussed today were:   GERD (heartburn/reflux) and hiatal hernia: 1. Glad you are doing better on omeprazole (Prilosec) 40 mg daily 2. Continue taking your current medications 3. Let us know if you have any worsening symptoms  Stool urgency, likely food sensitivities: 1. I would avoid foods that tend to cause a problem such as dairy 2. You can also use Imodium as needed for any urgency or soft/loose stools, especially if you are going to be leaving the house and have recently had a food that is typically going to cause problems 3. Call us if your symptoms become more consistent, more regular, or worse  Overall I recommend:  1. Continue your other current medications 2. Return for follow-up in 6 months 3. Call us for any questions or concerns   ---------------------------------------------------------------  I am glad you have gotten your COVID-19 vaccination!  Even though you are fully vaccinated you should continue to follow CDC and state/local guidelines.  ---------------------------------------------------------------   At [facility] we value your feedback. You may receive a survey about your visit today. Please share your experience as we strive to create trusting relationships with our patients to provide genuine, compassionate, quality care.  We appreciate your understanding and patience as we review any laboratory studies, imaging, and other diagnostic tests that are ordered as we care for you. Our office policy is 5 business days for review of these results, and any emergent or urgent results are addressed in a timely manner for your best interest. If you do not hear from our office in 1 week, please contact us.   We also encourage the use of MyChart, which contains your medical information for your review as well. If you are not enrolled in this feature, an access code is on this after visit summary for your convenience. Thank you for  allowing [place] to be involved in your care.  It was great to see you today!  I hope you have a Happy Thanksgiving!!

## 2020-05-15 NOTE — Progress Notes (Signed)
Referring Provider: Jake Samples, PA* Primary Care Physician:  Jake Samples, PA-C Primary GI:  Dr. Abbey Chatters  Chief Complaint  Patient presents with  . Gastroesophageal Reflux    c/o sticky mucus in chest that she eventually spits up    HPI:   Ashley Meza is a 74 y.o. female who presents for follow-up on GERD and hiatal hernia.  The patient was last seen in our office 11/13/2019 for the same.  Her last visit was a virtual office visit due to COVID-19/coronavirus pandemic.  Previous history of coughing up significant mucus, EKG previously normal.  Colonoscopy up-to-date 2017 with tubular adenoma and nonbleeding internal hemorrhoids with recommended 5-year to 10-year repeat in 2022-2027.  Previously noted significant reflux and esophageal pain, tried and failed Mylanta.  Previous 10 pound subjective weight loss due to grief with the passing of her husband but since stabilized.  H. pylori breath testing negative.  EGD 07/05/2019 with no obvious source for dyspepsia identified and most likely uncontrolled GERD or not ideally controlled GERD.  Small hiatal hernia.  Mild gastritis.  Biopsies with mild chronic gastritis with reactive changes and recommended referral to surgeon for Nissen fundoplication, follow-up in 4 months.  At her last visit GERD was much better on omeprazole 40 mg daily, rare breakthrough on PPI.  Wanting to hold off on hiatal hernia repair for now due to improvement.  No other GI complaints.  Recommended continue Prilosec 40 mg once daily, call for worsening symptoms, follow-up in 6 months.  Today she states she doing okay overall. Notes sticky mucus in her chest that she eventually will cough up. Improved GERD symptoms, rare esophageal burning about once a week. No more chest pain. She remains on Prilosec once a day. Feels this is keeping symptoms at [place]. Feels it's doing well enough. Thinks she may have irritable bowel, sometimes has fecal urgency depending on  diet; some things don't agree with her. Dairy and some other specific foods cause symptoms. Discussed food sensitivities with her. Denies abdominal pain, N/V, hematochezia, melena, fever, chills, unintentional weight loss. Denies URI or flu-like symptoms. Denies loss of sense of taste or smell. The patient has received COVID-19 vaccination(s). Denies chest pain, dyspnea, dizziness, lightheadedness, syncope, near syncope. Denies any other upper or lower GI symptoms.  Past Medical History:  Diagnosis Date  . Hypothyroidism     Past Surgical History:  Procedure Laterality Date  . BIOPSY  07/05/2019   Procedure: BIOPSY;  Surgeon: Danie Binder, MD;  [facility];  Service: Endoscopy;;  . BREAST LUMPECTOMY Left 1970  . COLONOSCOPY N/A 10/24/2015   Procedure: COLONOSCOPY;  Surgeon: Danie Binder, MD;  [facility];  Service: Endoscopy;  Laterality: N/A;  10:30 Am - moved to 8:30 - Candy notified pt  . ESOPHAGOGASTRODUODENOSCOPY N/A 07/05/2019   Procedure: ESOPHAGOGASTRODUODENOSCOPY (EGD);  Surgeon: Danie Binder, MD;  [facility];  Service: Endoscopy;  Laterality: N/A;  2:45pm - patient's ride not coming until 1:00, will try to get here a little early    Current Outpatient Medications  Medication Sig Dispense Refill  . aspirin 325 MG tablet Take 650 mg by mouth every 6 (six) hours as needed (pain/headaches.). About once a month    . EUTHYROX 88 MCG tablet Take 88 mcg by mouth daily before breakfast.    . omeprazole (PRILOSEC) 40 MG capsule Take 1 capsule (40 mg total) by mouth daily. 90 capsule 3   No current  facility-administered medications for this visit.    Allergies as of 05/15/2020 - Review Complete 05/15/2020  Allergen Reaction Noted  . Percocet [oxycodone-acetaminophen] Rash 09/17/2015    Family History  Problem Relation Age of Onset  . Hypertension Mother   . Colon cancer Neg Hx   . Gastric cancer Neg Hx   . Esophageal cancer Neg Hx      Social History   Socioeconomic History  . Marital status: Widowed    Spouse name: Not on file  . Number of children: Not on file  . Years of education: Not on file  . Highest education level: Not on file  Occupational History  . Not on file  Tobacco Use  . Smoking status: Former Smoker    Types: Cigarettes  . Smokeless tobacco: Never Used  . Tobacco comment: Quit around 2010  Substance and Sexual Activity  . Alcohol use: Yes    Comment: glass of wine every 6 months  . Drug use: No  . Sexual activity: Not on file  Other Topics Concern  . Not on file  Social History Narrative  . Not on file   Social Determinants of Health   Financial Resource Strain:   . Difficulty of Paying Living Expenses: Not on file  Food Insecurity:   . Worried About Charity fundraiser in the Last Year: Not on file  . Ran Out of Food in the Last Year: Not on file  Transportation Needs:   . Lack of Transportation (Medical): Not on file  . Lack of Transportation (Non-Medical): Not on file  Physical Activity:   . Days of Exercise per Week: Not on file  . Minutes of Exercise per Session: Not on file  Stress:   . Feeling of Stress : Not on file  Social Connections:   . Frequency of Communication with Friends and Family: Not on file  . Frequency of Social Gatherings with Friends and Family: Not on file  . Attends Religious Services: Not on file  . Active Member of Clubs or Organizations: Not on file  . Attends Archivist Meetings: Not on file  . Marital Status: Not on file    Subjective: Review of Systems  Constitutional: Negative for chills, fever, malaise/fatigue and weight loss.  HENT: Negative for congestion and sore throat.        Mucus drainage  Respiratory: Negative for cough and shortness of breath.   Cardiovascular: Negative for chest pain and palpitations.  Gastrointestinal: Negative for abdominal pain, blood in stool, diarrhea, heartburn, melena, nausea and vomiting.   Musculoskeletal: Negative for joint pain and myalgias.  Skin: Negative for rash.  Neurological: Negative for dizziness and weakness.  Endo/Heme/Allergies: Does not bruise/bleed easily.  Psychiatric/Behavioral: Negative for depression. The patient is not nervous/anxious.   All other systems reviewed and are negative.    Objective: BP 136/69   Pulse 65   Temp (!) 97.1 F (36.2 C)   Ht 5\' 4"  (1.626 m)   Wt 130 lb 9.6 oz (59.2 kg)   BMI 22.42 kg/m  Physical Exam Vitals and nursing note reviewed.  Constitutional:      General: She is not in acute distress.    Appearance: Normal appearance. She is well-developed and normal weight. She is not ill-appearing, toxic-appearing or diaphoretic.  HENT:     Head: Normocephalic and atraumatic.     Nose: No congestion or rhinorrhea.  Eyes:     General: No scleral icterus. Cardiovascular:     Rate  and Rhythm: Normal rate and regular rhythm.     Heart sounds: Normal heart sounds.  Pulmonary:     Effort: Pulmonary effort is normal. No respiratory distress.     Breath sounds: Normal breath sounds.  Abdominal:     General: Bowel sounds are normal.     Palpations: Abdomen is soft. There is no hepatomegaly, splenomegaly or mass.     Tenderness: There is no abdominal tenderness. There is no guarding or rebound.     Hernia: No hernia is present.  Skin:    General: Skin is warm and dry.     Coloration: Skin is not jaundiced.     Findings: No rash.  Neurological:     General: No focal deficit present.     Mental Status: She is alert and oriented to person, place, and time.  Psychiatric:        Attention and Perception: Attention normal.        Mood and Affect: Mood normal.        Speech: Speech normal.        Behavior: Behavior normal.        Thought Content: Thought content normal.        Cognition and Memory: Cognition and memory normal.      Assessment:  Very pleasant 74 year old female presents for follow-up on GERD and hiatal  hernia.  Overall doing well on changing PPI.  No significant problems.  Does discuss possibility of IBS although this seems to be more food sensitivities as discussed below.  No red flag or warning signs or symptoms.  GERD with hiatal hernia: Symptoms significantly improved on omeprazole 40 mg daily.  She feels the medication is working well enough for her at this time.  As previously discussed, she wants to hold off on hiatal hernia surgery unless it is absolutely necessary.  I feel this has been to do with her husband who passed away from elective gallbladder surgery within the past 1 to 2 years.  I agree that if there is not a significant benefit now that she is on appropriate medications she should hold off on the risk.  Recommend continue current medications and call us for any worsening  Occasional soft stools with fecal urgency: Not a consistent problem, it happens occasionally and typical with certain foods such as dairy.  After discussing with her I feel she likely does not have irritable bowel syndrome and more just food sensitivities.  We discussed the use of Imodium as needed for if she is going to go out and has had a food that is known to cause problems.  I have asked her to let me know if symptoms become more consistent or more bothersome.  Also she has a lack of consistent abdominal pain which would seem unlikely with IBS.   Plan: 1. Continue current medications 2. Call for worsening, use Imodium as needed 3. Call for worsening lower GI symptoms 4. Follow-up in 6 months    Thank you for allowing [place] to participate in the care of Persephanie F Pegues  Aava Deland, DNP, AGNP-C Adult & Gerontological Nurse Practitioner [facility]   05/15/2020 9:24 AM   Disclaimer: This note was dictated with voice recognition software. Similar sounding words can inadvertently be transcribed and may not be corrected upon review.

## 2020-05-15 NOTE — Progress Notes (Signed)
CC'ED TO PCP 

## 2020-06-19 ENCOUNTER — Other Ambulatory Visit: Payer: Self-pay

## 2020-06-19 ENCOUNTER — Ambulatory Visit (HOSPITAL_COMMUNITY)
Admission: RE | Admit: 2020-06-19 | Discharge: 2020-06-19 | Disposition: A | Discharge status: Home / Self Care | Payer: Medicare Other | Source: Ambulatory Visit | Attending: Internal Medicine | Admitting: Internal Medicine

## 2020-06-19 DIAGNOSIS — Z1231 Encounter for screening mammogram for malignant neoplasm of breast: Secondary | ICD-10-CM | POA: Diagnosis not present

## 2020-08-23 ENCOUNTER — Other Ambulatory Visit: Payer: Self-pay | Admitting: Nurse Practitioner

## 2020-08-23 DIAGNOSIS — K219 Gastro-esophageal reflux disease without esophagitis: Secondary | ICD-10-CM

## 2020-10-13 ENCOUNTER — Encounter: Payer: Self-pay | Admitting: Gastroenterology

## 2020-10-14 DIAGNOSIS — Z23 Encounter for immunization: Secondary | ICD-10-CM | POA: Diagnosis not present

## 2020-11-12 ENCOUNTER — Ambulatory Visit: Payer: Medicare Other | Admitting: Nurse Practitioner

## 2020-11-22 NOTE — Progress Notes (Signed)
Referring Provider: Jake Samples, PA* Primary Care Physician:  Jake Samples, PA-C Primary GI Physician: Dr. Abbey Chatters  Chief Complaint  Patient presents with  . Gastroesophageal Reflux    Doing okay    HPI:   Ashley Meza is a 75 y.o. female presenting today for follow-up.  History of GERD, hiatal hernia, adenomatous colon polyps.  Colonoscopy in 2017 with tubular adenoma and nonbleeding internal hemorrhoids with recommendations to repeat in 5-10 years.  EGD December 2020 with no obvious source for dyspepsia, small hiatal hernia, mild gastritis.  Biopsies with mild chronic gastritis with reactive changes.  Last seen in our office 05/15/2020.  GERD symptoms improved.  Esophageal burning about once a week on Prilosec 40 mg daily.  Fecal urgency related to diet, specifically dairy and some other food items.  No other significant GI symptoms.  Dr. Oneida Alar had previously recommended referral to general surgery for evaluation of hiatal hernia surgery, but patient preferred to hold off on this unless it was absolutely necessary.  She was advised to continue her current medications, use Imodium as needed, and follow-up in 6 months.  Today: GERD is well controlled on omeprazole 40 mg daily. No abdominal pain. No nausea or vomiting. No dysphagia. If eating out, she ends up with diarrhea. This is is chronic and unchanged.  Otherwise, BMs every other day. No constipation. No blood in the stools or black stools.   Documented 10 pound weight loss over the last 6 months.  Does not eat as much as she used to.  Coffee and bagel for breakfast. Snack for lunch. Meat and vegetables for dinner. No sweats. Used to eat sweats and snack more frequently.  States she has been busy.  Nothing is preventing her from eating.  Also states she was happy about her weight loss associated with better and her breathing state this summer.  Wants to hold off on colonoscopy.   Rare aspirin.   Past Medical  History:  Diagnosis Date  . GERD (gastroesophageal reflux disease)   . Hypothyroidism     Past Surgical History:  Procedure Laterality Date  . BIOPSY  07/05/2019   Procedure: BIOPSY;  Surgeon: Danie Binder, MD;  [facility];  Service: Endoscopy;;  . BREAST LUMPECTOMY Left 1970  . COLONOSCOPY N/A 10/24/2015   Procedure: COLONOSCOPY;  Surgeon: Danie Binder, MD;  [facility];  Service: Endoscopy;  Laterality: N/A;  10:30 Am - moved to 8:30 - Candy notified pt  . ESOPHAGOGASTRODUODENOSCOPY N/A 07/05/2019   Procedure: ESOPHAGOGASTRODUODENOSCOPY (EGD);  Surgeon: Danie Binder, MD;  [facility];  Service: Endoscopy;  Laterality: N/A;  2:45pm - patient's ride not coming until 1:00, will try to get here a little early    Current Outpatient Medications  Medication Sig Dispense Refill  . EUTHYROX 88 MCG tablet Take 88 mcg by mouth daily before breakfast.    . omeprazole (PRILOSEC) 40 MG capsule Take 1 capsule by mouth once daily 90 capsule 0  . aspirin 325 MG tablet Take 650 mg by mouth every 6 (six) hours as needed (pain/headaches.). About once a month (Patient not taking: Reported on 11/24/2020)     No current facility-administered medications for this visit.    Allergies as of 11/24/2020 - Review Complete 11/24/2020  Allergen Reaction Noted  . Percocet [oxycodone-acetaminophen] Rash 09/17/2015    Family History  Problem Relation Age of Onset  . Hypertension Mother   . Colon cancer Neg Hx   .  Gastric cancer Neg Hx   . Esophageal cancer Neg Hx     Social History   Socioeconomic History  . Marital status: Widowed    Spouse name: Not on file  . Number of children: Not on file  . Years of education: Not on file  . Highest education level: Not on file  Occupational History  . Not on file  Tobacco Use  . Smoking status: Former Smoker    Types: Cigarettes  . Smokeless tobacco: Never Used  . Tobacco comment: Quit around 2010  Substance and  Sexual Activity  . Alcohol use: Yes    Comment: glass of wine every 6 months  . Drug use: No  . Sexual activity: Not on file  Other Topics Concern  . Not on file  Social History Narrative  . Not on file   Social Determinants of Health   Financial Resource Strain: Not on file  Food Insecurity: Not on file  Transportation Needs: Not on file  Physical Activity: Not on file  Stress: Not on file  Social Connections: Not on file    Review of Systems: Gen: Denies fever, chills, cold or flulike symptoms, lightheadedness, dizziness, presyncope, syncope. CV: Denies chest pain or palpitations. Resp: Denies shortness of breath or cough. GI: See HPI Heme: See HPI  Physical Exam: BP 130/64   Pulse (!) 54   Temp (!) 97 F (36.1 C)   Ht 5\' 4"  (1.626 m)   Wt 120 lb (54.4 kg)   BMI 20.60 kg/m  General:   Alert and oriented. No distress noted. Pleasant and cooperative.  Head:  Normocephalic and atraumatic. Eyes:  Conjuctiva clear without scleral icterus. Heart:  S1, S2 present without murmurs appreciated. Lungs:  Clear to auscultation bilaterally. No wheezes, rales, or rhonchi. No distress.  Abdomen:  +BS, soft, non-tender and non-distended. No rebound or guarding. No HSM or masses noted. Msk:  Symmetrical without gross deformities. Normal posture. Extremities:  Without edema. Neurologic:  Alert and  oriented x4 Psych:  Normal mood and affect.   Assessment: 75 year old female with history of GERD, hiatal hernia, adenomatous colon polyps presenting today for follow-up.  Also with documented 10 pound weight loss over the last 6 months.  GERD: Well-controlled on Prilosec 40 mg daily.  No alarm symptoms.  Advised to continue her current medications.  History of adenomatous colon polyps: Last colonoscopy in 2017 with tubular adenoma and nonbleeding internal hemorrhoids.  Recommended repeat in 5-10 years.  Lower GI symptoms include chronic intermittent diarrhea if eating out, otherwise  bowel movements every other day without BRBPR or melena.  She does have documented 10 pound weight loss over the last 6 months.  She reports this is secondary to limiting dietary intake and not eating sweets.  We discussed scheduling a colonoscopy, but she prefers to hold off on this for now.   Weight loss: Documented 10 pound weight loss over the last 6 months.  Patient attributes this to eating less and not eating any sweets.  She does not feel this needs further evaluation at this time.  She does have the ability to weigh herself at home.  I advised that she increase her dietary intake with 3 meals daily, couple snacks daily, and can consider adding protein shakes once or twice daily.  She is to monitor her weight weekly and let me know if she loses an additional 5 pounds.   Plan: 1.  Continue Prilosec 40 mg daily 30 minutes before breakfast. 2.  3  full meals daily and 1-2 snacks daily.  Consider adding 1-2 protein shakes daily. 3.  Monitor weight weekly and let me know of weight loss of 5 or more pounds. 4.  Continue to avoid dietary triggers of diarrhea.  Let me know of any persistent diarrhea. 5.  Follow-up in 6 months.  Discuss scheduling colonoscopy at that time.    Aliene Altes, PA-C [facility] 11/24/2020

## 2020-11-24 ENCOUNTER — Ambulatory Visit (INDEPENDENT_AMBULATORY_CARE_PROVIDER_SITE_OTHER): Payer: Medicare Other | Admitting: Gastroenterology

## 2020-11-24 ENCOUNTER — Encounter: Payer: Self-pay | Admitting: Gastroenterology

## 2020-11-24 VITALS — BP 130/64 | HR 54 | Temp 97.0°F | Ht 64.0 in | Wt 120.0 lb

## 2020-11-24 DIAGNOSIS — R634 Abnormal weight loss: Secondary | ICD-10-CM

## 2020-11-24 DIAGNOSIS — K219 Gastro-esophageal reflux disease without esophagitis: Secondary | ICD-10-CM

## 2020-11-24 DIAGNOSIS — Z86010 Personal history of colonic polyps: Secondary | ICD-10-CM | POA: Diagnosis not present

## 2020-11-24 NOTE — Patient Instructions (Addendum)
Continue Prilosec 40 mg daily 30 minutes before breakfast.  As we discussed, you have lost 10 pounds over the last 6 months. I recommend you eat 3 full meals daily and 1-2 snacks to maintain your weight. You may also add protein shakes once or twice daily.  Please weigh yourself weekly and let me know if you lose an additional 5 or more pounds.  Continue to avoid known dietary triggers of diarrhea. Let me know of any persistent diarrhea.   We will plan to see back in 6 months.  Do not hesitate to call if you have any questions or concerns prior.  It was good to meet you today!   Aliene Altes, PA-C [facility]

## 2020-11-25 ENCOUNTER — Other Ambulatory Visit: Payer: Self-pay | Admitting: Nurse Practitioner

## 2020-11-25 DIAGNOSIS — K219 Gastro-esophageal reflux disease without esophagitis: Secondary | ICD-10-CM

## 2021-01-06 IMAGING — MG DIGITAL SCREENING BILATERAL MAMMOGRAM WITH TOMO AND CAD
8 series · 9 of 24 positions shown · non-contrast
Comparison: Previous exam(s).

CLINICAL DATA: Screening.

EXAM:
DIGITAL SCREENING BILATERAL MAMMOGRAM WITH TOMO AND CAD

[L CC synth-2D]
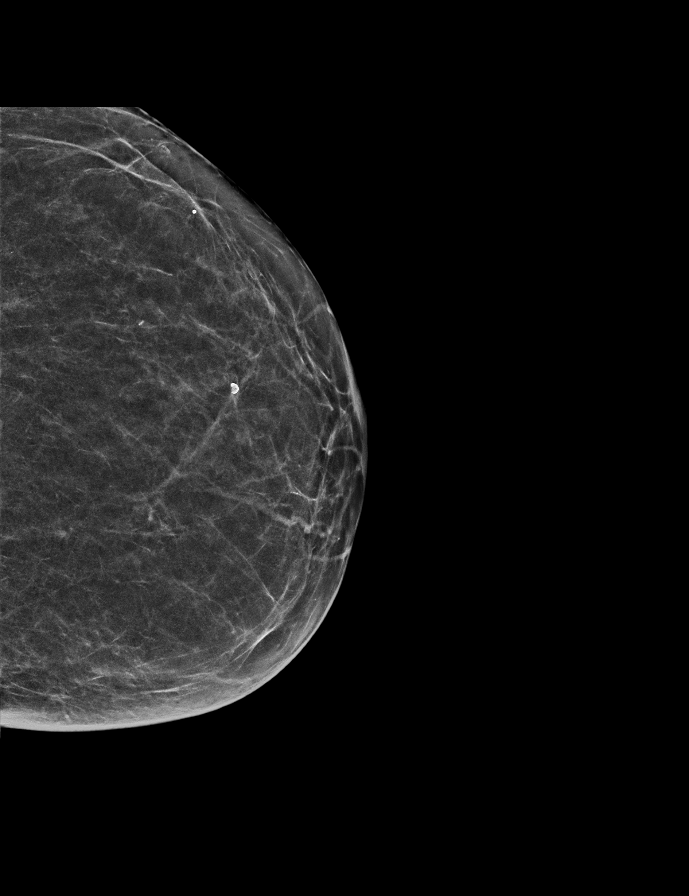

[R MLO synth-2D]
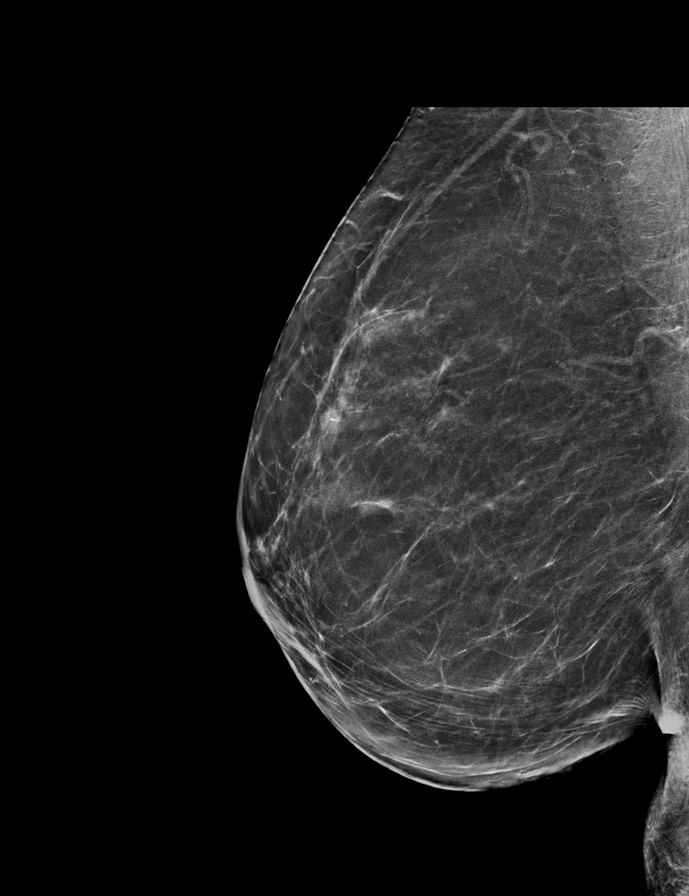

[R CC synth-2D]
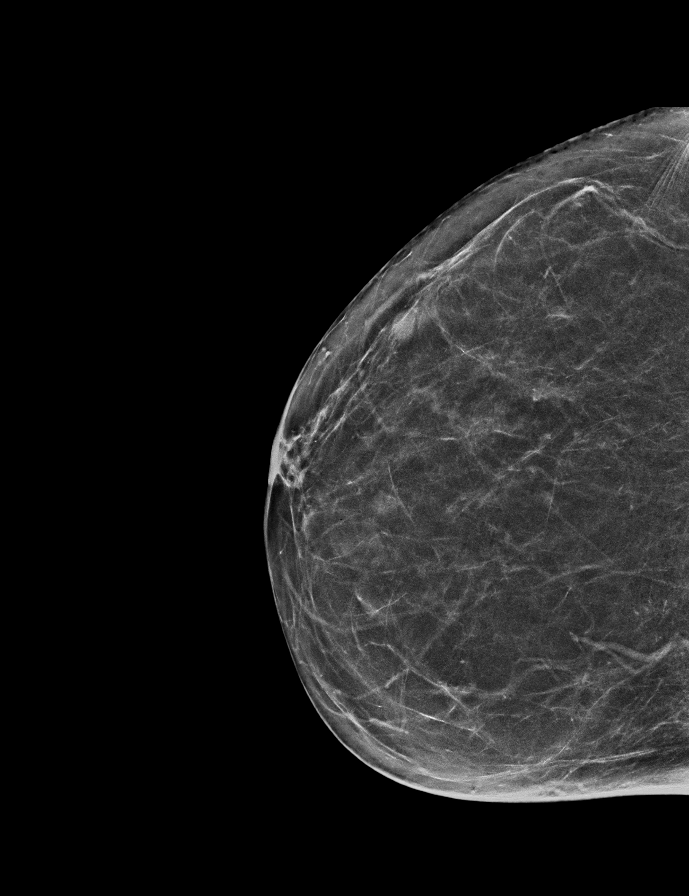

[L MLO synth-2D]
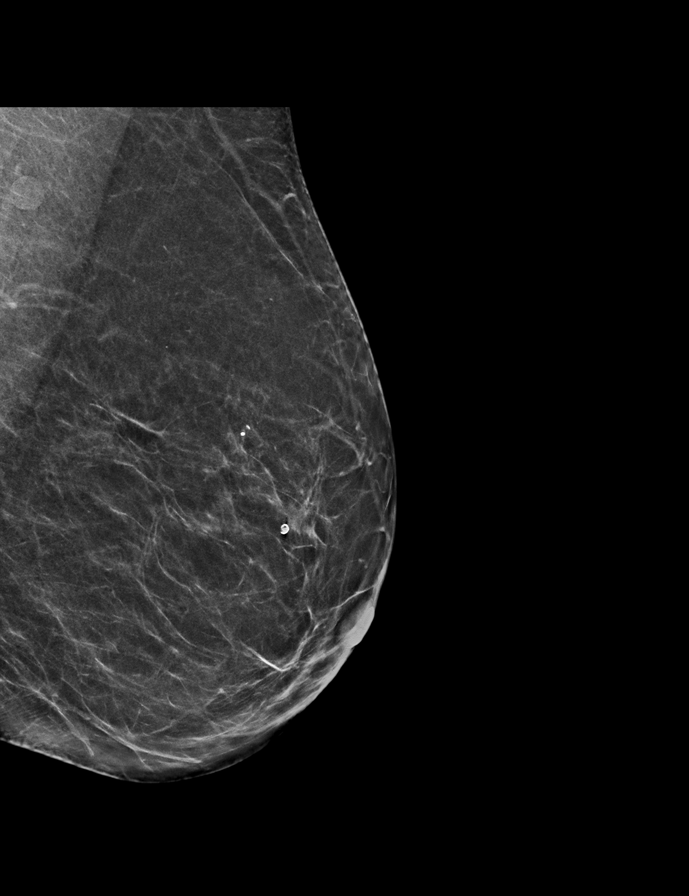

[R MLO tomo · 2 of 65 frames shown]
[frame 21/65]
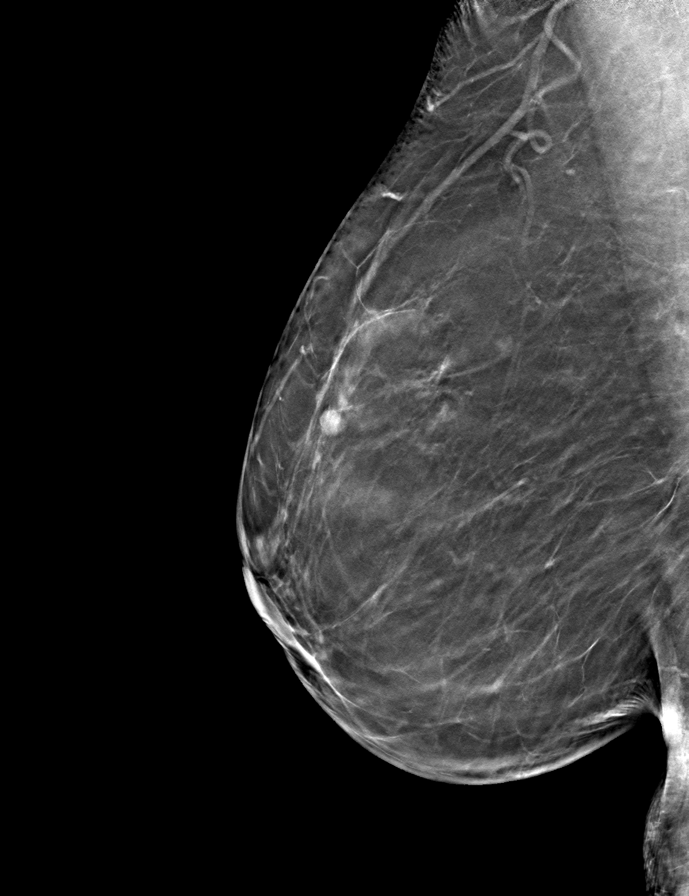
[frame 33/65]
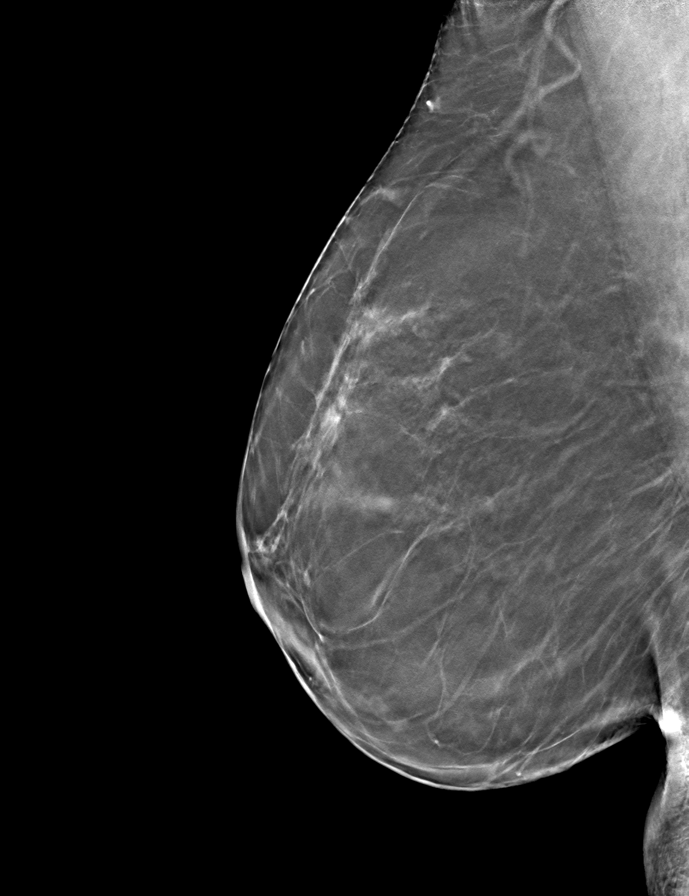

[R CC tomo · tomo slice 29/57.0]
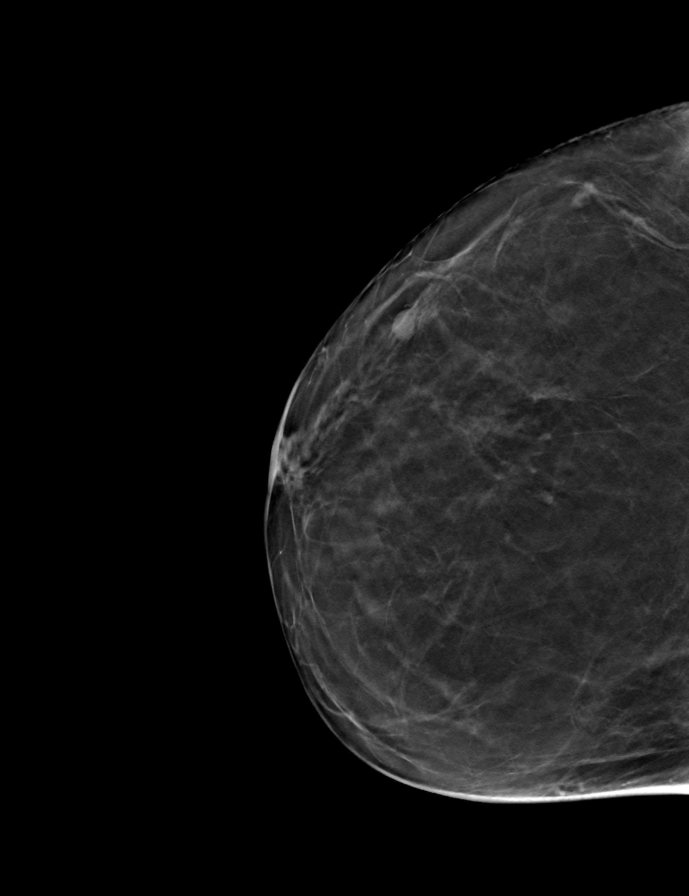

[L MLO tomo · tomo slice 31/61.0]
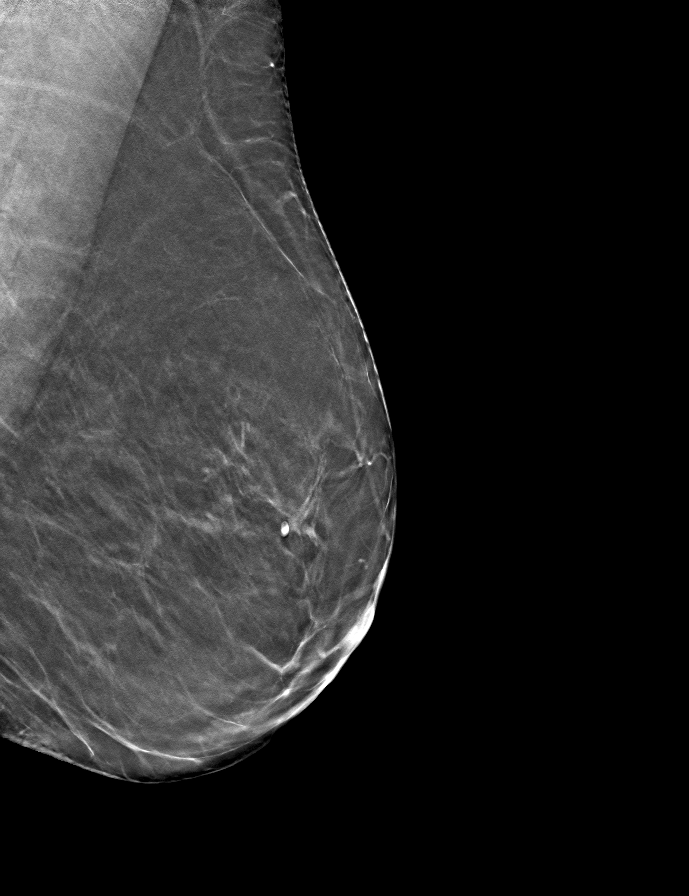

[L CC tomo · tomo slice 27/53.0]
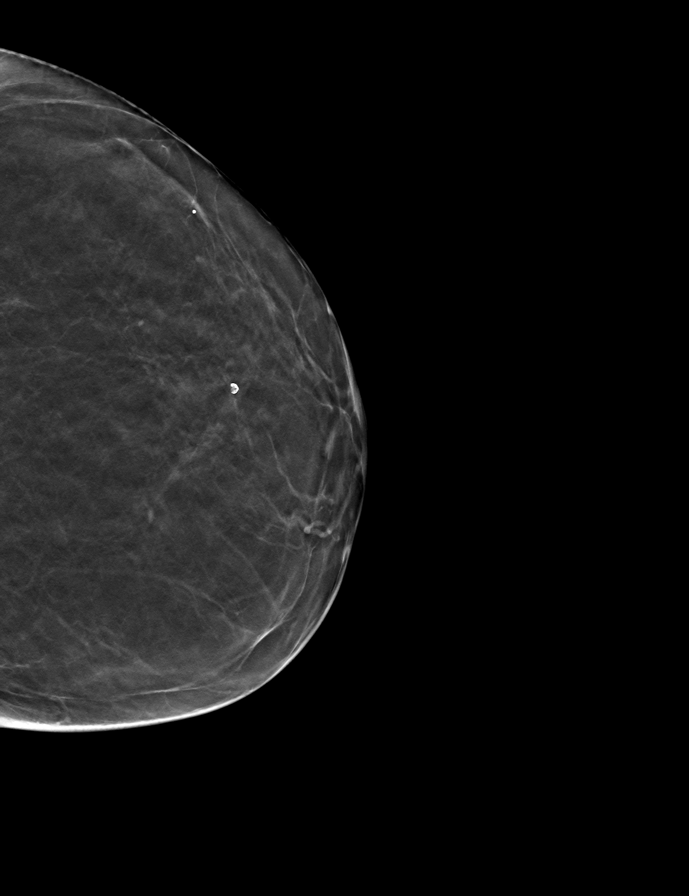

[9 of 24 positions shown; findings below may reference images not displayed]

ACR Breast Density Category b: There are scattered areas of
fibroglandular density.
FINDINGS: In the right breast, a possible mass warrants further evaluation. In
the left breast, no findings suspicious for malignancy. Images were
processed with CAD.
IMPRESSION: Further evaluation is suggested for possible mass in the right
breast.

RECOMMENDATION:
Diagnostic mammogram and possibly ultrasound of the right breast.
(Code:T1-A-550)

The patient will be contacted regarding the findings, and additional
imaging will be scheduled.

BI-RADS CATEGORY  0: Incomplete. Need additional imaging evaluation
and/or prior mammograms for comparison.

## 2021-03-04 DIAGNOSIS — L821 Other seborrheic keratosis: Secondary | ICD-10-CM | POA: Diagnosis not present

## 2021-03-04 DIAGNOSIS — D224 Melanocytic nevi of scalp and neck: Secondary | ICD-10-CM | POA: Diagnosis not present

## 2021-03-04 DIAGNOSIS — L728 Other follicular cysts of the skin and subcutaneous tissue: Secondary | ICD-10-CM | POA: Diagnosis not present

## 2021-03-16 DIAGNOSIS — Z23 Encounter for immunization: Secondary | ICD-10-CM | POA: Diagnosis not present

## 2021-03-26 DIAGNOSIS — Z0001 Encounter for general adult medical examination with abnormal findings: Secondary | ICD-10-CM | POA: Diagnosis not present

## 2021-03-26 DIAGNOSIS — Z1331 Encounter for screening for depression: Secondary | ICD-10-CM | POA: Diagnosis not present

## 2021-03-26 DIAGNOSIS — E7849 Other hyperlipidemia: Secondary | ICD-10-CM | POA: Diagnosis not present

## 2021-03-26 DIAGNOSIS — E782 Mixed hyperlipidemia: Secondary | ICD-10-CM | POA: Diagnosis not present

## 2021-03-26 DIAGNOSIS — E063 Autoimmune thyroiditis: Secondary | ICD-10-CM | POA: Diagnosis not present

## 2021-05-25 ENCOUNTER — Encounter: Payer: Self-pay | Admitting: Gastroenterology

## 2021-05-25 NOTE — Progress Notes (Signed)
Referring Provider: Jake Samples, PA* Primary Care Physician:  Jake Samples, PA-C Primary GI Physician: Dr. Abbey Chatters  Chief Complaint  Patient presents with   Gastroesophageal Reflux    Has good/bad days   Diarrhea    Occas depending on what she eats     HPI:   Ashley Meza is a 75 y.o. female with history of GERD, hiatal hernia, adenomatous colon polyps due for surveillance colonoscopy at this time, chronic intermittent diarrhea with eating out, presenting today for follow-up of GERD and weight loss.  Last seen in our office 11/24/2020.  GERD was well controlled on Prilosec 40 mg daily without alarm symptoms.  She was due for colonoscopy, but preferred to hold off.  Noted 10 pound weight loss over the last 6 months which patient attributed to eating less and not eating any sweets.  She preferred to continue to monitor for now.  Plan to continue Protonix daily, focus on maintaining weight with eating 3 full meals daily, 1-2 protein shakes daily, and let me know of additional weight loss of 5+ pounds, follow-up in 6 months.   Today:  GERD: Breakthrough a couple times a month. Overall well controlled on Prilosec 40 mg daily. No N/V, or abdominal pain. No dysphagia.   Weight loss: Gained 4 lbs since last visit. States she was very busy and was missing meals previously. Has been more intentional with ensuring she is eating regularly. Eats a light breakfast, maybe a snack for lunch, and eats dinner. Doesn't want to eat lunch because she will want to take a nap.  Denies early satiety.  Bowels are moving well without any significant diarrhea or constipation.  Denies BRBPR or melena.  Ready to schedule colonoscopy.    Past Medical History:  Diagnosis Date   GERD (gastroesophageal reflux disease)    HLD (hyperlipidemia)    Hypothyroidism     Past Surgical History:  Procedure Laterality Date   BIOPSY  07/05/2019   Procedure: BIOPSY;  Surgeon: Danie Binder, MD;   [facility]: [facility];  Service: Endoscopy;;   BREAST LUMPECTOMY Left 07/05/1968   COLONOSCOPY N/A 10/24/2015   Surgeon: Danie Binder, MD; diverticulosis, nonbleeding internal hemorrhoids, tubular adenoma.  Repeat in 5-10 years.   ESOPHAGOGASTRODUODENOSCOPY N/A 07/05/2019   Surgeon: Danie Binder, MD; no obvious source for dyspepsia, small hiatal hernia, mild gastritis.  Biopsies with mild chronic gastritis with reactive changes.    Current Outpatient Medications  Medication Sig Dispense Refill   aspirin 325 MG tablet Take 650 mg by mouth every 6 (six) hours as needed (pain/headaches.). About once a month     EUTHYROX 88 MCG tablet Take 88 mcg by mouth daily before breakfast.     omeprazole (PRILOSEC) 40 MG capsule Take 1 capsule by mouth once daily 90 capsule 3   rosuvastatin (CRESTOR) 5 MG tablet Take 5 mg by mouth daily.     polyethylene glycol-electrolytes (TRILYTE) 420 g solution Take 4,000 mLs by mouth as directed. 4000 mL 0   No current facility-administered medications for this visit.    Allergies as of 05/27/2021 - Review Complete 05/27/2021  Allergen Reaction Noted   Percocet [oxycodone-acetaminophen] Rash 09/17/2015    Family History  Problem Relation Age of Onset   Hypertension Mother    Barrett's esophagus Son    Colon cancer Neg Hx    Gastric cancer Neg Hx    Esophageal cancer Neg Hx     Social History   Socioeconomic  History   Marital status: Widowed    Spouse name: Not on file   Number of children: Not on file   Years of education: Not on file   Highest education level: Not on file  Occupational History   Not on file  Tobacco Use   Smoking status: Former    Types: Cigarettes   Smokeless tobacco: Never   Tobacco comments:    Quit around 2010  Substance and Sexual Activity   Alcohol use: Yes    Comment: glass of wine every 6 months   Drug use: No   Sexual activity: Not on file  Other Topics Concern   Not on file  Social History Narrative    Not on file   Social Determinants of Health   Financial Resource Strain: Not on file  Food Insecurity: Not on file  Transportation Needs: Not on file  Physical Activity: Not on file  Stress: Not on file  Social Connections: Not on file    Review of Systems: Gen: Denies fever, chills, cold or flulike symptoms, presyncope, syncope. CV: Denies chest pain, palpitations. Resp: Denies dyspnea or cough. GI: See HPI Heme: See HPI  Physical Exam: BP 107/64   Pulse 60   Temp (!) 97.5 F (36.4 C)   Ht 5\' 4"  (1.626 m)   Wt 124 lb 9.6 oz (56.5 kg)   BMI 21.39 kg/m  General:   Alert and oriented. No distress noted. Pleasant and cooperative.  Head:  Normocephalic and atraumatic. Eyes:  Conjuctiva clear without scleral icterus. Heart:  S1, S2 present without murmurs appreciated. Lungs:  Clear to auscultation bilaterally. No wheezes, rales, or rhonchi. No distress.  Abdomen:  +BS, soft, non-tender and non-distended. No rebound or guarding. No HSM or masses noted. Msk:  Symmetrical without gross deformities. Normal posture. Extremities:  Without edema. Neurologic:  Alert and  oriented x4 Psych:  Normal mood and affect.    Assessment: 75 year old female with history of GERD, hiatal hernia, adenomatous colon polyps, chronic intermittent diarrhea with eating out, presenting today for routine follow-up and to discuss scheduling surveillance colonoscopy.  History of adenomatous colon polyps:  Due for surveillance.  Last colonoscopy in 2017 with diverticulosis, nonbleeding internal hemorrhoids, tubular adenoma, recommended repeat in 5-10 years.  No significant lower GI symptoms or alarm symptoms.  No family history of colon cancer.  GERD:  Fairly well controlled on omeprazole 40 mg daily.  Occasional breakthrough symptoms a couple times a month.  No alarm symptoms.  Weight loss:  Previously noted 10 pound weight loss between November 2021 in May 2022.  Patient attributed this to eating less  due to being busy and cutting out sweets.  Since that time, she has been more intentional with her intake and has gained 4 pounds.  Denies nausea, vomiting, abdominal pain, early satiety, or any other significant symptoms.   Plan:  Proceed with colonoscopy with propofol with Dr. Abbey Chatters in the near future. The risks, benefits, and alternatives have been discussed with the patient in detail. The patient states understanding and desires to proceed.  ASA 2 Continue Prilosec 40 mg daily. Reinforced GERD diet/lifestyle. Advise she continue eating at least 2 meals per day.  Also recommended adding protein shake for lunch as she prefers not to eat a full meal for lunch. Follow-up in 6 months or sooner if needed.   Aliene Altes, PA-C [facility] 05/27/2021

## 2021-05-25 NOTE — H&P (View-Only) (Signed)
Referring Provider: Jake Samples, PA* Primary Care Physician:  Jake Samples, PA-C Primary GI Physician: Dr. Abbey Chatters  Chief Complaint  Patient presents with   Gastroesophageal Reflux    Has good/bad days   Diarrhea    Occas depending on what she eats     HPI:   Ashley Meza is a 75 y.o. female with history of GERD, hiatal hernia, adenomatous colon polyps due for surveillance colonoscopy at this time, chronic intermittent diarrhea with eating out, presenting today for follow-up of GERD and weight loss.  Last seen in our office 11/24/2020.  GERD was well controlled on Prilosec 40 mg daily without alarm symptoms.  She was due for colonoscopy, but preferred to hold off.  Noted 10 pound weight loss over the last 6 months which patient attributed to eating less and not eating any sweets.  She preferred to continue to monitor for now.  Plan to continue Protonix daily, focus on maintaining weight with eating 3 full meals daily, 1-2 protein shakes daily, and let me know of additional weight loss of 5+ pounds, follow-up in 6 months.   Today:  GERD: Breakthrough a couple times a month. Overall well controlled on Prilosec 40 mg daily. No N/V, or abdominal pain. No dysphagia.   Weight loss: Gained 4 lbs since last visit. States she was very busy and was missing meals previously. Has been more intentional with ensuring she is eating regularly. Eats a light breakfast, maybe a snack for lunch, and eats dinner. Doesn't want to eat lunch because she will want to take a nap.  Denies early satiety.  Bowels are moving well without any significant diarrhea or constipation.  Denies BRBPR or melena.  Ready to schedule colonoscopy.    Past Medical History:  Diagnosis Date   GERD (gastroesophageal reflux disease)    HLD (hyperlipidemia)    Hypothyroidism     Past Surgical History:  Procedure Laterality Date   BIOPSY  07/05/2019   Procedure: BIOPSY;  Surgeon: Danie Binder, MD;   [facility]: [facility];  Service: Endoscopy;;   BREAST LUMPECTOMY Left 07/05/1968   COLONOSCOPY N/A 10/24/2015   Surgeon: Danie Binder, MD; diverticulosis, nonbleeding internal hemorrhoids, tubular adenoma.  Repeat in 5-10 years.   ESOPHAGOGASTRODUODENOSCOPY N/A 07/05/2019   Surgeon: Danie Binder, MD; no obvious source for dyspepsia, small hiatal hernia, mild gastritis.  Biopsies with mild chronic gastritis with reactive changes.    Current Outpatient Medications  Medication Sig Dispense Refill   aspirin 325 MG tablet Take 650 mg by mouth every 6 (six) hours as needed (pain/headaches.). About once a month     EUTHYROX 88 MCG tablet Take 88 mcg by mouth daily before breakfast.     omeprazole (PRILOSEC) 40 MG capsule Take 1 capsule by mouth once daily 90 capsule 3   rosuvastatin (CRESTOR) 5 MG tablet Take 5 mg by mouth daily.     polyethylene glycol-electrolytes (TRILYTE) 420 g solution Take 4,000 mLs by mouth as directed. 4000 mL 0   No current facility-administered medications for this visit.    Allergies as of 05/27/2021 - Review Complete 05/27/2021  Allergen Reaction Noted   Percocet [oxycodone-acetaminophen] Rash 09/17/2015    Family History  Problem Relation Age of Onset   Hypertension Mother    Barrett's esophagus Son    Colon cancer Neg Hx    Gastric cancer Neg Hx    Esophageal cancer Neg Hx     Social History   Socioeconomic  History   Marital status: Widowed    Spouse name: Not on file   Number of children: Not on file   Years of education: Not on file   Highest education level: Not on file  Occupational History   Not on file  Tobacco Use   Smoking status: Former    Types: Cigarettes   Smokeless tobacco: Never   Tobacco comments:    Quit around 2010  Substance and Sexual Activity   Alcohol use: Yes    Comment: glass of wine every 6 months   Drug use: No   Sexual activity: Not on file  Other Topics Concern   Not on file  Social History Narrative    Not on file   Social Determinants of Health   Financial Resource Strain: Not on file  Food Insecurity: Not on file  Transportation Needs: Not on file  Physical Activity: Not on file  Stress: Not on file  Social Connections: Not on file    Review of Systems: Gen: Denies fever, chills, cold or flulike symptoms, presyncope, syncope. CV: Denies chest pain, palpitations. Resp: Denies dyspnea or cough. GI: See HPI Heme: See HPI  Physical Exam: BP 107/64   Pulse 60   Temp (!) 97.5 F (36.4 C)   Ht 5\' 4"  (1.626 m)   Wt 124 lb 9.6 oz (56.5 kg)   BMI 21.39 kg/m  General:   Alert and oriented. No distress noted. Pleasant and cooperative.  Head:  Normocephalic and atraumatic. Eyes:  Conjuctiva clear without scleral icterus. Heart:  S1, S2 present without murmurs appreciated. Lungs:  Clear to auscultation bilaterally. No wheezes, rales, or rhonchi. No distress.  Abdomen:  +BS, soft, non-tender and non-distended. No rebound or guarding. No HSM or masses noted. Msk:  Symmetrical without gross deformities. Normal posture. Extremities:  Without edema. Neurologic:  Alert and  oriented x4 Psych:  Normal mood and affect.    Assessment: 75 year old female with history of GERD, hiatal hernia, adenomatous colon polyps, chronic intermittent diarrhea with eating out, presenting today for routine follow-up and to discuss scheduling surveillance colonoscopy.  History of adenomatous colon polyps:  Due for surveillance.  Last colonoscopy in 2017 with diverticulosis, nonbleeding internal hemorrhoids, tubular adenoma, recommended repeat in 5-10 years.  No significant lower GI symptoms or alarm symptoms.  No family history of colon cancer.  GERD:  Fairly well controlled on omeprazole 40 mg daily.  Occasional breakthrough symptoms a couple times a month.  No alarm symptoms.  Weight loss:  Previously noted 10 pound weight loss between November 2021 in May 2022.  Patient attributed this to eating less  due to being busy and cutting out sweets.  Since that time, she has been more intentional with her intake and has gained 4 pounds.  Denies nausea, vomiting, abdominal pain, early satiety, or any other significant symptoms.   Plan:  Proceed with colonoscopy with propofol with Dr. Abbey Chatters in the near future. The risks, benefits, and alternatives have been discussed with the patient in detail. The patient states understanding and desires to proceed.  ASA 2 Continue Prilosec 40 mg daily. Reinforced GERD diet/lifestyle. Advise she continue eating at least 2 meals per day.  Also recommended adding protein shake for lunch as she prefers not to eat a full meal for lunch. Follow-up in 6 months or sooner if needed.   Aliene Altes, PA-C [facility]astroenter[facility] 05/27/2021

## 2021-05-27 ENCOUNTER — Ambulatory Visit (INDEPENDENT_AMBULATORY_CARE_PROVIDER_SITE_OTHER): Payer: Medicare Other | Admitting: Gastroenterology

## 2021-05-27 ENCOUNTER — Encounter: Payer: Self-pay | Admitting: Gastroenterology

## 2021-05-27 ENCOUNTER — Other Ambulatory Visit: Payer: Self-pay

## 2021-05-27 VITALS — BP 107/64 | HR 60 | Temp 97.5°F | Ht 64.0 in | Wt 124.6 lb

## 2021-05-27 DIAGNOSIS — K219 Gastro-esophageal reflux disease without esophagitis: Secondary | ICD-10-CM

## 2021-05-27 DIAGNOSIS — R634 Abnormal weight loss: Secondary | ICD-10-CM

## 2021-05-27 DIAGNOSIS — Z86010 Personal history of colonic polyps: Secondary | ICD-10-CM

## 2021-05-27 MED ORDER — PEG 3350-KCL-NA BICARB-NACL 420 G PO SOLR: 4000.0000 mL | ORAL | 0 refills | Status: DC

## 2021-05-27 NOTE — Patient Instructions (Addendum)
We will arrange for you to have a colonoscopy with Dr. Abbey Chatters in the near future.  Continue taking Prilosec 40 mg daily 30 minutes before breakfast.  Follow a GERD diet:  Avoid fried, fatty, greasy, spicy, citrus foods. Avoid caffeine and carbonated beverages. Avoid chocolate. Try eating 4-6 small meals a day rather than 3 large meals. Do not eat within 3 hours of laying down. Prop head of bed up on wood or bricks to create a 6 inch incline.  Continue being intentional with eating- eating at least 2 meals a day. You can try adding protein shakes for lunch as you prefer not to eat a meal.   We will plan to follow-up with you in about 6 months.  Do not hesitate to call if you have any questions or concerns prior to your next visit.  It was great seeing you again today!  I hope you have a very happy Thanksgiving!  Aliene Altes, PA-C [facility]enter[facility]

## 2021-06-05 ENCOUNTER — Ambulatory Visit (HOSPITAL_COMMUNITY): Payer: Medicare Other | Admitting: Anesthesiology

## 2021-06-05 ENCOUNTER — Encounter (HOSPITAL_COMMUNITY)
Admission: RE | Disposition: A | Discharge status: Home / Self Care | Payer: Self-pay | Source: Home / Self Care | Attending: Internal Medicine

## 2021-06-05 ENCOUNTER — Other Ambulatory Visit: Payer: Self-pay

## 2021-06-05 ENCOUNTER — Encounter (HOSPITAL_COMMUNITY): Payer: Self-pay

## 2021-06-05 ENCOUNTER — Ambulatory Visit (HOSPITAL_COMMUNITY)
Admission: RE | Admit: 2021-06-05 | Discharge: 2021-06-05 | Disposition: A | Discharge status: Home / Self Care | Payer: Medicare Other | Attending: Internal Medicine | Admitting: Internal Medicine

## 2021-06-05 DIAGNOSIS — K635 Polyp of colon: Secondary | ICD-10-CM | POA: Diagnosis not present

## 2021-06-05 DIAGNOSIS — Z87891 Personal history of nicotine dependence: Secondary | ICD-10-CM | POA: Diagnosis not present

## 2021-06-05 DIAGNOSIS — K648 Other hemorrhoids: Secondary | ICD-10-CM | POA: Insufficient documentation

## 2021-06-05 DIAGNOSIS — Z86010 Personal history of colonic polyps: Secondary | ICD-10-CM

## 2021-06-05 DIAGNOSIS — E039 Hypothyroidism, unspecified: Secondary | ICD-10-CM | POA: Diagnosis not present

## 2021-06-05 DIAGNOSIS — D123 Benign neoplasm of transverse colon: Secondary | ICD-10-CM | POA: Diagnosis not present

## 2021-06-05 DIAGNOSIS — K219 Gastro-esophageal reflux disease without esophagitis: Secondary | ICD-10-CM | POA: Insufficient documentation

## 2021-06-05 DIAGNOSIS — K5730 Diverticulosis of large intestine without perforation or abscess without bleeding: Secondary | ICD-10-CM | POA: Diagnosis not present

## 2021-06-05 DIAGNOSIS — Z1211 Encounter for screening for malignant neoplasm of colon: Secondary | ICD-10-CM | POA: Insufficient documentation

## 2021-06-05 HISTORY — PX: COLONOSCOPY WITH PROPOFOL: SHX5780

## 2021-06-05 HISTORY — PX: POLYPECTOMY: SHX5525

## 2021-06-05 SURGERY — COLONOSCOPY WITH PROPOFOL
Anesthesia: General

## 2021-06-05 MED ORDER — PROPOFOL 10 MG/ML IV BOLUS
INTRAVENOUS | Status: DC | PRN
  Administered 2021-06-05: 50 mg via INTRAVENOUS
  Administered 2021-06-05: 100 mg via INTRAVENOUS

## 2021-06-05 MED ORDER — LACTATED RINGERS IV SOLN: INTRAVENOUS | Status: DC

## 2021-06-05 MED ORDER — LIDOCAINE HCL (CARDIAC) PF 100 MG/5ML IV SOSY
PREFILLED_SYRINGE | INTRAVENOUS | Status: DC | PRN
  Administered 2021-06-05: 50 mg via INTRAVENOUS

## 2021-06-05 NOTE — Anesthesia Preprocedure Evaluation (Signed)
Anesthesia Evaluation  Patient identified by MRN, date of birth, ID band Patient awake    Reviewed: Allergy & Precautions, H&P , NPO status , Patient's Chart, lab work & pertinent test results, reviewed documented beta blocker date and time   Airway Mallampati: II  TM Distance: >3 FB Neck ROM: full    Dental no notable dental hx.    Pulmonary neg pulmonary ROS, former smoker,    Pulmonary exam normal breath sounds clear to auscultation       Cardiovascular Exercise Tolerance: Good negative cardio ROS   Rhythm:regular Rate:Normal     Neuro/Psych negative neurological ROS  negative psych ROS   GI/Hepatic Neg liver ROS, hiatal hernia, GERD  Medicated,  Endo/Other  Hypothyroidism   Renal/GU negative Renal ROS  negative genitourinary   Musculoskeletal   Abdominal   Peds  Hematology negative hematology ROS (+)   Anesthesia Other Findings   Reproductive/Obstetrics negative OB ROS                             Anesthesia Physical Anesthesia Plan  ASA: 2  Anesthesia Plan: General   Post-op Pain Management:    Induction:   PONV Risk Score and Plan: Propofol infusion  Airway Management Planned:   Additional Equipment:   Intra-op Plan:   Post-operative Plan:   Informed Consent: I have reviewed the patients History and Physical, chart, labs and discussed the procedure including the risks, benefits and alternatives for the proposed anesthesia with the patient or authorized representative who has indicated his/her understanding and acceptance.     Dental Advisory Given  Plan Discussed with: CRNA  Anesthesia Plan Comments:         Anesthesia Quick Evaluation

## 2021-06-05 NOTE — Anesthesia Postprocedure Evaluation (Signed)
Anesthesia Post Note  Patient: Ashley Meza  Procedure(s) Performed: COLONOSCOPY WITH PROPOFOL POLYPECTOMY  Patient location during evaluation: Phase II Anesthesia Type: General Level of consciousness: awake Pain management: pain level controlled Vital Signs Assessment: post-procedure vital signs reviewed and stable Respiratory status: spontaneous breathing and respiratory function stable Cardiovascular status: blood pressure returned to baseline and stable Postop Assessment: no headache and no apparent nausea or vomiting Anesthetic complications: no Comments: Late entry   No notable events documented.   Last Vitals:  Vitals:   06/05/21 1411 06/05/21 1413  BP: (!) 89/62 100/62  Pulse: 61 64  Resp: 14 16  Temp: 36.6 C   SpO2: 98% 98%    Last Pain:  Vitals:   06/05/21 1413  TempSrc:   PainSc: 0-No pain                 [name]

## 2021-06-05 NOTE — Op Note (Signed)
[facility] Patient Name: Ashley Meza Procedure Date: 06/05/2021 1:29 PM MRN: [id number] Date of Birth: [birth year] Attending MD: Elon Alas. Edgar Frisk CSN: [id number] Age: 75 Admit Type: Outpatient Procedure:                Colonoscopy Indications:              High risk colon cancer surveillance: Personal                            history of colonic polyps Providers:                Elon Alas. Abbey Chatters, DO, Jessica Boudreaux, Randa Spike, Technician Referring MD:              Medicines:                See the Anesthesia note for documentation of the                            administered medications Complications:            No immediate complications. Estimated Blood Loss:     Estimated blood loss was minimal. Procedure:                Pre-Anesthesia Assessment:                           - The anesthesia plan was to use monitored                            anesthesia care (MAC).                           After obtaining informed consent, the colonoscope                            was passed under direct vision. Throughout the                            procedure, the patient's blood pressure, pulse, and                            oxygen saturations were monitored continuously. The                            [id number]-HQ190L ([id number]) scope was introduced through                            the anus and advanced to the the cecum, identified                            by appendiceal orifice and ileocecal valve. The                            colonoscopy was performed without difficulty. The  patient tolerated the procedure well. The quality                            of the bowel preparation was evaluated using the                            BBPS [facility] Bowel Preparation Scale) with scores                            of: Right Colon = 3, Transverse Colon = 3 and Left                            Colon = 3 (entire mucosa seen well  with no residual                            staining, small fragments of stool or opaque                            liquid). The total BBPS score equals 9. Scope In: 1:59:25 PM Scope Out: 2:08:35 PM Scope Withdrawal Time: 0 hours 6 minutes 40 seconds  Total Procedure Duration: 0 hours 9 minutes 10 seconds  Findings:      The perianal and digital rectal examinations were normal.      Non-bleeding internal hemorrhoids were found during endoscopy.      A 5 mm polyp was found in the transverse colon. The polyp was sessile.       The polyp was removed with a cold snare. Resection and retrieval were       complete.      Multiple small-mouthed diverticula were found in the sigmoid colon.      The exam was otherwise without abnormality. Impression:               - Non-bleeding internal hemorrhoids.                           - One 5 mm polyp in the transverse colon, removed                            with a cold snare. Resected and retrieved.                           - Diverticulosis in the sigmoid colon.                           - The examination was otherwise normal. Moderate Sedation:      Per Anesthesia Care Recommendation:           - Patient has a contact number available for                            emergencies. The signs and symptoms of potential                            delayed complications were discussed with the  patient. Return to normal activities tomorrow.                            Written discharge instructions were provided to the                            patient.                           - Resume previous diet.                           - Continue present medications.                           - Await pathology results.                           - Repeat colonoscopy in 5 years for surveillance if                            benefits outweigh risks                           - Return to GI clinic in 6 months. Procedure Code(s):        ---  Professional ---                           [phone], Colonoscopy, flexible; with removal of                            tumor(s), polyp(s), or other lesion(s) by snare                            technique Diagnosis Code(s):        --- Professional ---                           Z86.010, Personal history of colonic polyps                           K63.5, Polyp of colon                           K64.8, Other hemorrhoids                           K57.30, Diverticulosis of large intestine without                            perforation or abscess without bleeding CPT copyright 2019 American Medical Association. All rights reserved. The codes documented in this report are preliminary and upon coder review may  be revised to meet current compliance requirements. Elon Alas. Abbey Chatters, DO [place] Abbey Chatters, DO 06/05/2021 2:12:05 PM This report has been signed electronically. Number of Addenda: 0

## 2021-06-05 NOTE — Transfer of Care (Signed)
Immediate Anesthesia Transfer of Care Note  Patient: Ashley Meza  Procedure(s) Performed: COLONOSCOPY WITH PROPOFOL POLYPECTOMY  Patient Location: Endoscopy Unit  Anesthesia Type:General  Level of Consciousness: drowsy  Airway & Oxygen Therapy: Patient Spontanous Breathing  Post-op Assessment: Report given to RN and Post -op Vital signs reviewed and stable  Post vital signs: Reviewed and stable  Last Vitals:  Vitals Value Taken Time  BP    Temp    Pulse    Resp    SpO2      Last Pain:  Vitals:   06/05/21 1357  TempSrc:   PainSc: 0-No pain      Patients Stated Pain Goal: 8 (22/63/33 [id number])  Complications: No notable events documented.

## 2021-06-05 NOTE — Discharge Instructions (Addendum)
  Colonoscopy Discharge Instructions  Read the instructions outlined below and refer to this sheet in the next few weeks. These discharge instructions provide you with general information on caring for yourself after you leave the hospital. Your doctor may also give you specific instructions. While your treatment has been planned according to the most current medical practices available, unavoidable complications occasionally occur.   ACTIVITY You may resume your regular activity, but move at a slower pace for the next 24 hours.  Take frequent rest periods for the next 24 hours.  Walking will help get rid of the air and reduce the bloated feeling in your belly (abdomen).  No driving for 24 hours (because of the medicine (anesthesia) used during the test).   Do not sign any important legal documents or operate any machinery for 24 hours (because of the anesthesia used during the test).  NUTRITION Drink plenty of fluids.  You may resume your normal diet as instructed by your doctor.  Begin with a light meal and progress to your normal diet. Heavy or fried foods are harder to digest and may make you feel sick to your stomach (nauseated).  Avoid alcoholic beverages for 24 hours or as instructed.  MEDICATIONS You may resume your normal medications unless your doctor tells you otherwise.  WHAT YOU CAN EXPECT TODAY Some feelings of bloating in the abdomen.  Passage of more gas than usual.  Spotting of blood in your stool or on the toilet paper.  IF YOU HAD POLYPS REMOVED DURING THE COLONOSCOPY: No aspirin products for 7 days or as instructed.  No alcohol for 7 days or as instructed.  Eat a soft diet for the next 24 hours.  FINDING OUT THE RESULTS OF YOUR TEST Not all test results are available during your visit. If your test results are not back during the visit, make an appointment with your caregiver to find out the results. Do not assume everything is normal if you have not heard from your  caregiver or the medical facility. It is important for you to follow up on all of your test results.  SEEK IMMEDIATE MEDICAL ATTENTION IF: You have more than a spotting of blood in your stool.  Your belly is swollen (abdominal distention).  You are nauseated or vomiting.  You have a temperature over 101.  You have abdominal pain or discomfort that is severe or gets worse throughout the day.   Your colonoscopy revealed 1 polyp(s) which I removed successfully. Await pathology results, my office will contact you. I recommend repeating colonoscopy in 5 years for surveillance purposes if benefits outweigh risks. Follow up with GI in 4-6 months  I hope you have a great rest of your week!  Elon Alas. Abbey Chatters, D.O. Gastroenterology and Hepatology [facility]     MESSAGE LEFT AT OFFICE VOICE MAIL TO CALL PATIENT WITH FOLLOW UP APPOINTMENT WITH KRISTEN HARPER, PA IN 4MONTHS

## 2021-06-05 NOTE — Interval H&P Note (Signed)
History and Physical Interval Note:  06/05/2021 1:35 PM  Ashley Meza  has presented today for surgery, with the diagnosis of history of adenomatous colon polyps, weight loss.  The various methods of treatment have been discussed with the patient and family. After consideration of risks, benefits and other options for treatment, the patient has consented to  Procedure(s) with comments: COLONOSCOPY WITH PROPOFOL (N/A) - 3:00pm as a surgical intervention.  The patient's history has been reviewed, patient examined, no change in status, stable for surgery.  I have reviewed the patient's chart and labs.  Questions were answered to the patient's satisfaction.     [name]

## 2021-06-08 ENCOUNTER — Encounter: Payer: Self-pay | Admitting: Internal Medicine

## 2021-06-09 LAB — SURGICAL PATHOLOGY

## 2021-06-10 ENCOUNTER — Encounter (HOSPITAL_COMMUNITY): Payer: Self-pay | Admitting: Internal Medicine

## 2021-09-16 ENCOUNTER — Other Ambulatory Visit (HOSPITAL_COMMUNITY): Payer: Self-pay | Admitting: Family Medicine

## 2021-09-16 DIAGNOSIS — Z13820 Encounter for screening for osteoporosis: Secondary | ICD-10-CM

## 2021-09-16 DIAGNOSIS — Z780 Asymptomatic menopausal state: Secondary | ICD-10-CM

## 2021-09-16 DIAGNOSIS — Z1231 Encounter for screening mammogram for malignant neoplasm of breast: Secondary | ICD-10-CM

## 2021-09-21 ENCOUNTER — Ambulatory Visit (HOSPITAL_COMMUNITY): Payer: Medicare Other

## 2021-09-23 ENCOUNTER — Ambulatory Visit (HOSPITAL_COMMUNITY)
Admission: RE | Admit: 2021-09-23 | Discharge: 2021-09-23 | Disposition: A | Discharge status: Home / Self Care | Payer: Medicare Other | Source: Ambulatory Visit | Attending: Family Medicine | Admitting: Family Medicine

## 2021-09-23 ENCOUNTER — Other Ambulatory Visit: Payer: Self-pay

## 2021-09-23 DIAGNOSIS — M8589 Other specified disorders of bone density and structure, multiple sites: Secondary | ICD-10-CM | POA: Diagnosis not present

## 2021-09-23 DIAGNOSIS — Z780 Asymptomatic menopausal state: Secondary | ICD-10-CM | POA: Diagnosis not present

## 2021-09-23 DIAGNOSIS — Z13820 Encounter for screening for osteoporosis: Secondary | ICD-10-CM | POA: Insufficient documentation

## 2021-09-23 DIAGNOSIS — Z1231 Encounter for screening mammogram for malignant neoplasm of breast: Secondary | ICD-10-CM | POA: Insufficient documentation

## 2021-10-06 ENCOUNTER — Encounter: Payer: Self-pay | Admitting: Gastroenterology

## 2021-10-06 NOTE — Progress Notes (Signed)
? ? ?Referring Provider: Jake Samples, PA* ?Primary Care Physician:  Jake Samples, PA-C ?Primary GI Physician: Dr. Abbey Chatters ? ?Chief Complaint  ?Patient presents with  ? Follow-up  ?  Sometimes heart burn   ? ? ?HPI:   ?Ashley Meza is a 76 y.o. female with history of GERD, hiatal hernia, adenomatous colon polyps, chronic intermittent diarrhea with eating out, presenting today for follow-up s/p colonoscopy.  ? ?Last seen in our office 05/27/2021.  GERD was well controlled on Prilosec 40 mg daily.  Bowels were moving well without alarm symptoms.  She was scheduled for routine surveillance colonoscopy.  ? ?Colonoscopy completed 06/05/2021 with non-bleeding internal hemorrhoids, 5 mm tubular adenoma, sigmoid diverticulosis. Recommended repeat in 5 years.  ? ?Today:  ? ?GERD:  ?Occasional reflux every 3 weeks or so. No nausea, vomiting, dysphagia. Weight is stable.  Bowels are moving well without BRBPR or melena. ? ?Past Medical History:  ?Diagnosis Date  ? GERD (gastroesophageal reflux disease)   ? HLD (hyperlipidemia)   ? Hypothyroidism   ? ? ?Past Surgical History:  ?Procedure Laterality Date  ? BIOPSY  07/05/2019  ? Procedure: BIOPSY;  Surgeon: Danie Binder, MD;  [facility];  Service: Endoscopy;;  ? BREAST LUMPECTOMY Left 07/05/1968  ? COLONOSCOPY N/A 10/24/2015  ? Surgeon: Danie Binder, MD; diverticulosis, nonbleeding internal hemorrhoids, tubular adenoma.  Repeat in 5-10 years.  ? COLONOSCOPY WITH PROPOFOL N/A 06/05/2021  ? Surgeon: Hurshel Keys K, DO; non-bleeding internal hemorrhoids, 5 mm tubular adenoma, sigmoid diverticulosis. Recommended repeat in 5 years.  ? ESOPHAGOGASTRODUODENOSCOPY N/A 07/05/2019  ? Surgeon: Danie Binder, MD; no obvious source for dyspepsia, small hiatal hernia, mild gastritis.  Biopsies with mild chronic gastritis with reactive changes.  ? POLYPECTOMY  06/05/2021  ? Procedure: POLYPECTOMY;  Surgeon: [name], DO;  [facility];   Service: Endoscopy;;  ? ? ?Current Outpatient Medications  ?Medication Sig Dispense Refill  ? aspirin 325 MG tablet Take 650 mg by mouth every 6 (six) hours as needed (pain/headaches.). About once a month    ? Calcium Carbonate-Vitamin D (CALCIUM-VITAMIN D3 PO) Take 1 tablet by mouth daily.    ? EUTHYROX 88 MCG tablet Take 88 mcg by mouth daily before breakfast.    ? rosuvastatin (CRESTOR) 5 MG tablet Take 5 mg by mouth daily.    ? omeprazole (PRILOSEC) 40 MG capsule Take 1 capsule (40 mg total) by mouth daily. 90 capsule 3  ? ?No current facility-administered medications for this visit.  ? ? ?Allergies as of 10/08/2021 - Review Complete 10/08/2021  ?Allergen Reaction Noted  ? Percocet [oxycodone-acetaminophen] Itching and Rash 09/17/2015  ? ? ?Family History  ?Problem Relation Age of Onset  ? Hypertension Mother   ? Barrett's esophagus Son   ? Colon cancer Neg Hx   ? Gastric cancer Neg Hx   ? Esophageal cancer Neg Hx   ? ? ?Social History  ? ?Socioeconomic History  ? Marital status: Widowed  ?  Spouse name: Not on file  ? Number of children: Not on file  ? Years of education: Not on file  ? Highest education level: Not on file  ?Occupational History  ? Not on file  ?Tobacco Use  ? Smoking status: Former  ?  Types: Cigarettes  ? Smokeless tobacco: Never  ? Tobacco comments:  ?  Quit around 2010  ?Substance and Sexual Activity  ? Alcohol use: Yes  ?  Comment: glass of wine  every 6 months  ? Drug use: No  ? Sexual activity: Not on file  ?Other Topics Concern  ? Not on file  ?Social History Narrative  ? Not on file  ? ?Social Determinants of Health  ? ?Financial Resource Strain: Not on file  ?Food Insecurity: Not on file  ?Transportation Needs: Not on file  ?Physical Activity: Not on file  ?Stress: Not on file  ?Social Connections: Not on file  ? ? ?Review of Systems: ?Gen: Denies fever, chills, cold or flulike symptoms, presyncope, syncope. ?CV: Denies chest pain, palpitations. ?Resp: Denies dyspnea or cough. ?GI:  See HPI ?Heme: See HPI ? ?Physical Exam: ?BP 123/64   Pulse 64   Temp 98.2 ?F (36.8 ?C) (Temporal)   Ht '5\' 4"'$  (1.626 m)   Wt 124 lb 9.6 oz (56.5 kg)   BMI 21.39 kg/m?  ?General:   Alert and oriented. No distress noted. Pleasant and cooperative.  ?Head:  Normocephalic and atraumatic. ?Eyes:  Conjuctiva clear without scleral icterus. ?Heart:  S1, S2 present without murmurs appreciated. ?Lungs:  Clear to auscultation bilaterally. No wheezes, rales, or rhonchi. No distress.  ?Abdomen:  +BS, soft, non-tender and non-distended. No rebound or guarding. No HSM or masses noted. ?Msk:  Symmetrical without gross deformities. Normal posture. ?Extremities:  Without edema. ?Neurologic:  Alert and  oriented x4 ?Psych:  Normal mood and affect. ? ? ? ?Assessment:  ?76 year old female with history of GERD, hiatal hernia, adenomatous colon polyps with colonoscopy recently updated December 2022, due for surveillance in 2027, presenting today for routine follow-up of GERD. ? ?GERD: ?Fairly well controlled on omeprazole 40 mg daily.  Occasional breakthrough symptoms once every 3 weeks or so.  No alarm symptoms.  ? ? ?Plan:  ?Continue omeprazole 40 mg daily 30 minutes before breakfast. ?Use Pepcid 20 mg as needed for breakthrough reflux symptoms. ?Reinforced GERD diet/lifestyle.  Separate written instructions provided. ?Follow-up in 1 year or sooner if needed. ? ? ?Aliene Altes, PA-C ?[facility] ?10/08/2021  ?

## 2021-10-08 ENCOUNTER — Ambulatory Visit (INDEPENDENT_AMBULATORY_CARE_PROVIDER_SITE_OTHER): Payer: Medicare Other | Admitting: Gastroenterology

## 2021-10-08 ENCOUNTER — Encounter: Payer: Self-pay | Admitting: Gastroenterology

## 2021-10-08 DIAGNOSIS — K219 Gastro-esophageal reflux disease without esophagitis: Secondary | ICD-10-CM | POA: Diagnosis not present

## 2021-10-08 MED ORDER — OMEPRAZOLE 40 MG PO CPDR: 40.0000 mg | DELAYED_RELEASE_CAPSULE | Freq: Every day | ORAL | 3 refills | Status: DC

## 2021-10-08 NOTE — Patient Instructions (Signed)
Continue omeprazole 40 mg daily 30 minutes before breakfast. ? ?For occasional breakthrough heartburn symptoms, you can use Pepcid 20 mg as needed.  You can get this over-the-counter. ? ?Follow a GERD diet:  ?Avoid fried, fatty, greasy, spicy, citrus foods. ?Avoid caffeine and carbonated beverages. ?Avoid chocolate. ?Try eating 4-6 small meals a day rather than 3 large meals. ?Do not eat within 3 hours of laying down. ?Prop head of bed up on wood or bricks to create a 6 inch incline. ? ?We will plan to follow-up with you in 1 year.  Do not hesitate to call if you have any questions or concerns prior to your next visit. ? ?It was very good to see you again today!  I hope you have a happy Ivor Costa! ? ?Aliene Altes, PA-C ?[facility] ? ? ?

## 2022-04-01 DIAGNOSIS — R7309 Other abnormal glucose: Secondary | ICD-10-CM | POA: Diagnosis not present

## 2022-04-01 DIAGNOSIS — E782 Mixed hyperlipidemia: Secondary | ICD-10-CM | POA: Diagnosis not present

## 2022-04-01 DIAGNOSIS — Z6821 Body mass index (BMI) 21.0-21.9, adult: Secondary | ICD-10-CM | POA: Diagnosis not present

## 2022-04-01 DIAGNOSIS — Z23 Encounter for immunization: Secondary | ICD-10-CM | POA: Diagnosis not present

## 2022-04-01 DIAGNOSIS — Z0001 Encounter for general adult medical examination with abnormal findings: Secondary | ICD-10-CM | POA: Diagnosis not present

## 2022-04-01 DIAGNOSIS — E7849 Other hyperlipidemia: Secondary | ICD-10-CM | POA: Diagnosis not present

## 2022-04-01 DIAGNOSIS — E063 Autoimmune thyroiditis: Secondary | ICD-10-CM | POA: Diagnosis not present

## 2022-04-01 DIAGNOSIS — Z13820 Encounter for screening for osteoporosis: Secondary | ICD-10-CM | POA: Diagnosis not present

## 2022-04-01 DIAGNOSIS — E559 Vitamin D deficiency, unspecified: Secondary | ICD-10-CM | POA: Diagnosis not present

## 2022-04-01 DIAGNOSIS — Z1331 Encounter for screening for depression: Secondary | ICD-10-CM | POA: Diagnosis not present

## 2022-04-07 DIAGNOSIS — Z23 Encounter for immunization: Secondary | ICD-10-CM | POA: Diagnosis not present

## 2022-09-16 ENCOUNTER — Encounter: Payer: Self-pay | Admitting: Internal Medicine

## 2022-09-28 ENCOUNTER — Other Ambulatory Visit (HOSPITAL_COMMUNITY): Payer: Self-pay | Admitting: Family Medicine

## 2022-09-28 DIAGNOSIS — Z1231 Encounter for screening mammogram for malignant neoplasm of breast: Secondary | ICD-10-CM

## 2022-10-06 ENCOUNTER — Ambulatory Visit (HOSPITAL_COMMUNITY)
Admission: RE | Admit: 2022-10-06 | Discharge: 2022-10-06 | Disposition: A | Discharge status: Home / Self Care | Payer: Medicare Other | Source: Ambulatory Visit | Attending: Family Medicine | Admitting: Family Medicine

## 2022-10-06 ENCOUNTER — Encounter (HOSPITAL_COMMUNITY): Payer: Self-pay

## 2022-10-06 DIAGNOSIS — Z1231 Encounter for screening mammogram for malignant neoplasm of breast: Secondary | ICD-10-CM | POA: Insufficient documentation

## 2022-10-06 NOTE — Progress Notes (Unsigned)
Referring Provider: Jake Samples, PA* Primary Care Physician:  Jake Samples, PA-C Primary GI Physician: Dr. Abbey Chatters  No chief complaint on file.   HPI:   Ashley Meza is a 77 y.o. female with history of GERD, hiatal hernia, adenomatous colon polyps, chronic intermittent diarrhea with eating out, presenting today for follow-up .  Colonoscopy up-to-date December 2022 with nonbleeding internal hemorrhoids, 5 mm tubular adenoma, sigmoid diverticulosis.  Recommended 5-year surveillance.  Last seen in our office 10/08/2021.  GERD was well-controlled on omeprazole 40 mg daily.  Rare breakthrough.  No other significant GI symptoms.  She was advised to continue current medications, use Pepcid 20 mg for breakthrough reflux, follow-up in 1 year.  Today:    Past Medical History:  Diagnosis Date   GERD (gastroesophageal reflux disease)    HLD (hyperlipidemia)    Hypothyroidism     Past Surgical History:  Procedure Laterality Date   BIOPSY  07/05/2019   Procedure: BIOPSY;  Surgeon: Danie Binder, MD;  [facility];  Service: Endoscopy;;   BREAST LUMPECTOMY Left 07/05/1968   COLONOSCOPY N/A 10/24/2015   Surgeon: Danie Binder, MD; diverticulosis, nonbleeding internal hemorrhoids, tubular adenoma.  Repeat in 5-10 years.   COLONOSCOPY WITH PROPOFOL N/A 06/05/2021   Surgeon: Hurshel Keys K, DO; non-bleeding internal hemorrhoids, 5 mm tubular adenoma, sigmoid diverticulosis. Recommended repeat in 5 years.   ESOPHAGOGASTRODUODENOSCOPY N/A 07/05/2019   Surgeon: Danie Binder, MD; no obvious source for dyspepsia, small hiatal hernia, mild gastritis.  Biopsies with mild chronic gastritis with reactive changes.   POLYPECTOMY  06/05/2021   Procedure: POLYPECTOMY;  Surgeon: [name], DO;  [facility];  Service: Endoscopy;;    Current Outpatient Medications  Medication Sig Dispense Refill   aspirin 325 MG tablet Take 650 mg by mouth every 6  (six) hours as needed (pain/headaches.). About once a month     Calcium Carbonate-Vitamin D (CALCIUM-VITAMIN D3 PO) Take 1 tablet by mouth daily.     EUTHYROX 88 MCG tablet Take 88 mcg by mouth daily before breakfast.     omeprazole (PRILOSEC) 40 MG capsule Take 1 capsule (40 mg total) by mouth daily. 90 capsule 3   rosuvastatin (CRESTOR) 5 MG tablet Take 5 mg by mouth daily.     No current facility-administered medications for this visit.    Allergies as of 10/07/2022 - Review Complete 10/08/2021  Allergen Reaction Noted   Percocet [oxycodone-acetaminophen] Itching and Rash 09/17/2015    Family History  Problem Relation Age of Onset   Hypertension Mother    Barrett's esophagus Son    Colon cancer Neg Hx    Gastric cancer Neg Hx    Esophageal cancer Neg Hx     Social History   Socioeconomic History   Marital status: Widowed    Spouse name: Not on file   Number of children: Not on file   Years of education: Not on file   Highest education level: Not on file  Occupational History   Not on file  Tobacco Use   Smoking status: Former    Types: Cigarettes   Smokeless tobacco: Never   Tobacco comments:    Quit around 2010  Substance and Sexual Activity   Alcohol use: Yes    Comment: glass of wine every 6 months   Drug use: No   Sexual activity: Not on file  Other Topics Concern   Not on file  Social History Narrative  Not on file   Social Determinants of Health   Financial Resource Strain: Not on file  Food Insecurity: Not on file  Transportation Needs: Not on file  Physical Activity: Not on file  Stress: Not on file  Social Connections: Not on file    Review of Systems: Gen: Denies fever, chills, anorexia. Denies fatigue, weakness, weight loss.  CV: Denies chest pain, palpitations, syncope, peripheral edema, and claudication. Resp: Denies dyspnea at rest, cough, wheezing, coughing up blood, and pleurisy. GI: Denies vomiting blood, jaundice, and fecal  incontinence.   Denies dysphagia or odynophagia. Derm: Denies rash, itching, dry skin Psych: Denies depression, anxiety, memory loss, confusion. No homicidal or suicidal ideation.  Heme: Denies bruising, bleeding, and enlarged lymph nodes.  Physical Exam: There were no vitals taken for this visit. General:   Alert and oriented. No distress noted. Pleasant and cooperative.  Head:  Normocephalic and atraumatic. Eyes:  Conjuctiva clear without scleral icterus. Heart:  S1, S2 present without murmurs appreciated. Lungs:  Clear to auscultation bilaterally. No wheezes, rales, or rhonchi. No distress.  Abdomen:  +BS, soft, non-tender and non-distended. No rebound or guarding. No HSM or masses noted. Msk:  Symmetrical without gross deformities. Normal posture. Extremities:  Without edema. Neurologic:  Alert and  oriented x4 Psych:  Normal mood and affect.    Assessment:     Plan:  ***   Aliene Altes, PA-C [facility] 10/07/2022

## 2022-10-07 ENCOUNTER — Encounter: Payer: Self-pay | Admitting: Gastroenterology

## 2022-10-07 ENCOUNTER — Ambulatory Visit (INDEPENDENT_AMBULATORY_CARE_PROVIDER_SITE_OTHER): Payer: Medicare Other | Admitting: Gastroenterology

## 2022-10-07 VITALS — BP 138/75 | HR 64 | Temp 98.2°F | Ht 65.0 in | Wt 121.0 lb

## 2022-10-07 DIAGNOSIS — K219 Gastro-esophageal reflux disease without esophagitis: Secondary | ICD-10-CM | POA: Diagnosis not present

## 2022-10-07 MED ORDER — PANTOPRAZOLE SODIUM 40 MG PO TBEC: 40.0000 mg | DELAYED_RELEASE_TABLET | Freq: Every day | ORAL | 3 refills | Status: DC

## 2022-10-07 NOTE — Patient Instructions (Signed)
Stop omeprazole.  Start pantoprazole 40 mg daily 30 minutes before breakfast.  You may continue to use Pepcid 20 mg as needed for breakthrough heartburn symptoms.  After couple of weeks, if you have not noticed any improvement in your reflux symptoms with pantoprazole, please let me know.  Continue to follow a GERD diet/lifestyle: Avoid fried, fatty, greasy, spicy, citrus foods. Avoid caffeine and carbonated beverages. Avoid chocolate. Try eating 4-6 small meals a day rather than 3 large meals. Do not eat within 3 hours of laying down. Prop head of bed up on wood or bricks to create a 6 inch incline.  We we will follow-up with you in 6 months or sooner if needed.  It was good to see you again today!  Aliene Altes, PA-C [facility]

## 2022-11-08 ENCOUNTER — Telehealth: Payer: Self-pay | Admitting: *Deleted

## 2022-11-08 NOTE — Telephone Encounter (Signed)
Received approval letter for pantoprazole 40mg . Sent copy to scan center.

## 2022-11-08 NOTE — Telephone Encounter (Signed)
Noted  

## 2023-02-14 ENCOUNTER — Other Ambulatory Visit: Payer: Self-pay | Admitting: Gastroenterology

## 2023-02-14 DIAGNOSIS — K219 Gastro-esophageal reflux disease without esophagitis: Secondary | ICD-10-CM

## 2023-03-30 ENCOUNTER — Encounter: Payer: Self-pay | Admitting: Gastroenterology

## 2023-04-11 DIAGNOSIS — Z23 Encounter for immunization: Secondary | ICD-10-CM | POA: Diagnosis not present

## 2023-04-20 DIAGNOSIS — Z0001 Encounter for general adult medical examination with abnormal findings: Secondary | ICD-10-CM | POA: Diagnosis not present

## 2023-04-20 DIAGNOSIS — E782 Mixed hyperlipidemia: Secondary | ICD-10-CM | POA: Diagnosis not present

## 2023-04-20 DIAGNOSIS — R7309 Other abnormal glucose: Secondary | ICD-10-CM | POA: Diagnosis not present

## 2023-04-20 DIAGNOSIS — Z1331 Encounter for screening for depression: Secondary | ICD-10-CM | POA: Diagnosis not present

## 2023-04-20 DIAGNOSIS — Z681 Body mass index (BMI) 19 or less, adult: Secondary | ICD-10-CM | POA: Diagnosis not present

## 2023-04-20 DIAGNOSIS — E063 Autoimmune thyroiditis: Secondary | ICD-10-CM | POA: Diagnosis not present

## 2023-04-20 DIAGNOSIS — E7849 Other hyperlipidemia: Secondary | ICD-10-CM | POA: Diagnosis not present

## 2023-04-25 NOTE — Progress Notes (Unsigned)
Referring Provider: Avis Epley, PA* Primary Care Physician:  Avis Epley, PA-C Primary GI Physician: Dr. Marletta Lor  Chief Complaint  Patient presents with   Follow-up    Follow up. No problems     HPI:   Ashley Meza is a 77 y.o. female with history of GERD, hiatal hernia, adenomatous colon polyps, chronic intermittent diarrhea when eating out, presenting today for follow-up of GERD.   Last seen in the office 10/07/2022.  She was having breakthrough heartburn a couple times a week on omeprazole 40 mg daily using Pepcid as needed.  Recommended trying pantoprazole in place of omeprazole and limiting soda/coffee as she was otherwise avoiding common reflux triggers.  Today: Reports having less breakthrough heartburn symptoms on pantoprazole 40 mg daily.  She does have some very mild symptoms if eating known dietary triggers including spicy foods or chocolate.  Denies dysphagia, nausea, vomiting.  No bowel issues.  Denies BRBPR, melena.  Past Medical History:  Diagnosis Date   GERD (gastroesophageal reflux disease)    HLD (hyperlipidemia)    Hypothyroidism     Past Surgical History:  Procedure Laterality Date   BIOPSY  07/05/2019   Procedure: BIOPSY;  Surgeon: West Bali, MD;  [facility];  Service: Endoscopy;;   BREAST LUMPECTOMY Left 07/05/1968   COLONOSCOPY N/A 10/24/2015   Surgeon: West Bali, MD; diverticulosis, nonbleeding internal hemorrhoids, tubular adenoma.  Repeat in 5-10 years.   COLONOSCOPY WITH PROPOFOL N/A 06/05/2021   Surgeon: Earnest Bailey K, DO; non-bleeding internal hemorrhoids, 5 mm tubular adenoma, sigmoid diverticulosis. Recommended repeat in 5 years.   ESOPHAGOGASTRODUODENOSCOPY N/A 07/05/2019   Surgeon: West Bali, MD; no obvious source for dyspepsia, small hiatal hernia, mild gastritis.  Biopsies with mild chronic gastritis with reactive changes.   POLYPECTOMY  06/05/2021   Procedure: POLYPECTOMY;  Surgeon:  Lanelle Bal, DO;  [facility];  Service: Endoscopy;;    Current Outpatient Medications  Medication Sig Dispense Refill   Calcium Carbonate-Vitamin D (CALCIUM-VITAMIN D3 PO) Take 1 tablet by mouth daily.     Cyanocobalamin (VITAMIN B 12) 500 MCG TABS Take by mouth.     pantoprazole (PROTONIX) 40 MG tablet TAKE 1 TABLET BY MOUTH ONCE DAILY BEFORE BREAKFAST 30 tablet 3   potassium chloride (KLOR-CON) 8 MEQ tablet Take 8 mEq by mouth daily.     rosuvastatin (CRESTOR) 5 MG tablet Take 5 mg by mouth daily.     EUTHYROX 88 MCG tablet Take 88 mcg by mouth daily before breakfast.     No current facility-administered medications for this visit.    Allergies as of 04/27/2023 - Review Complete 04/27/2023  Allergen Reaction Noted   Percocet [oxycodone-acetaminophen] Itching and Rash 09/17/2015    Family History  Problem Relation Age of Onset   Hypertension Mother    Barrett's esophagus Son    Colon cancer Neg Hx    Gastric cancer Neg Hx    Esophageal cancer Neg Hx     Social History   Socioeconomic History   Marital status: Widowed    Spouse name: Not on file   Number of children: Not on file   Years of education: Not on file   Highest education level: Not on file  Occupational History   Not on file  Tobacco Use   Smoking status: Former    Types: Cigarettes   Smokeless tobacco: Never   Tobacco comments:    Quit around 2010  Substance and  Sexual Activity   Alcohol use: Yes    Comment: glass of wine every 6 months   Drug use: No   Sexual activity: Not on file  Other Topics Concern   Not on file  Social History Narrative   Not on file   Social Determinants of Health   Financial Resource Strain: Not on file  Food Insecurity: Not on file  Transportation Needs: Not on file  Physical Activity: Not on file  Stress: Not on file  Social Connections: Not on file    Review of Systems: Gen: Denies fever, chills, cold or flulike symptoms, presyncope, syncope.   GI: See HPI Heme: See HPI  Physical Exam: BP 132/68 (BP Location: Right Arm, Patient Position: Sitting, Cuff Size: Normal)   Pulse (!) 58   Temp 97.9 F (36.6 C) (Temporal)   Ht 5\' 4"  (1.626 m)   Wt 117 lb (53.1 kg)   BMI 20.08 kg/m  General:   Alert and oriented. No distress noted. Pleasant and cooperative.  Head:  Normocephalic and atraumatic. Eyes:  Conjuctiva clear without scleral icterus. Heart:  S1, S2 present without murmurs appreciated. Lungs:  Clear to auscultation bilaterally. No wheezes, rales, or rhonchi. No distress.  Abdomen:  +BS, soft, non-tender and non-distended. No rebound or guarding. No HSM or masses noted. Msk:  Symmetrical without gross deformities. Normal posture. Extremities:  Without edema. Neurologic:  Alert and  oriented x4 Psych:  Normal mood and affect.    Assessment:  77 y.o. female with history of GERD, hiatal hernia, adenomatous colon polyps, chronic intermittent diarrhea with eating out, presenting today for follow-up of GERD. Symptoms are overall improved since transitioning from omeprazole to pantoprazole 40 mg daily.  She does have some occasional breakthrough if eating known dietary triggers, but nothing severe.  No alarm symptoms.  Recommended avoiding known dietary triggers as much as possible and using pepcid prior if she is going to eat a known trigger.     Plan:  Continue pantoprazole 40 mg daily. Reinforced GERD diet/lifestyle and advised to avoid known dietary triggers of reflux is much as possible. May take Pepcid prior to consuming known dietary trigger. Patient will let me know if reflux continues 2-3 times a week despite the above.  Would consider increasing pantoprazole to twice daily at that point.   Ermalinda Memos, PA-C [facility] 04/27/2023

## 2023-04-27 ENCOUNTER — Encounter: Payer: Self-pay | Admitting: Gastroenterology

## 2023-04-27 ENCOUNTER — Ambulatory Visit (INDEPENDENT_AMBULATORY_CARE_PROVIDER_SITE_OTHER): Payer: Medicare Other | Admitting: Gastroenterology

## 2023-04-27 VITALS — BP 132/68 | HR 58 | Temp 97.9°F | Ht 64.0 in | Wt 117.0 lb

## 2023-04-27 DIAGNOSIS — K219 Gastro-esophageal reflux disease without esophagitis: Secondary | ICD-10-CM | POA: Diagnosis not present

## 2023-04-27 NOTE — Patient Instructions (Signed)
Continue taking pantoprazole 40 mg daily 30 minutes before breakfast.  In general, avoid known dietary triggers of reflux, but if you are going to occasionally have something that may trigger your reflux, you can try taking Pepcid prior to see if this will prevent your reflux symptoms.  GERD diet/lifestyle recommendations:  Avoid fried, fatty, greasy, spicy, citrus foods. Avoid caffeine and carbonated beverages. Avoid chocolate. Try eating 4-6 small meals a day rather than 3 large meals. Do not eat within 3 hours of laying down. Prop head of bed up on wood or bricks to create a 6 inch incline.   If you continue to have reflux symptoms 2-3 times a week, let me know.  We will plan to follow-up in 6 months or sooner if needed.  It was good to see you again today!   Ermalinda Memos, PA-C [facility]

## 2023-08-01 ENCOUNTER — Other Ambulatory Visit: Payer: Self-pay | Admitting: Gastroenterology

## 2023-08-01 DIAGNOSIS — K219 Gastro-esophageal reflux disease without esophagitis: Secondary | ICD-10-CM

## 2023-10-05 ENCOUNTER — Encounter: Payer: Self-pay | Admitting: Gastroenterology

## 2023-11-07 ENCOUNTER — Other Ambulatory Visit (HOSPITAL_COMMUNITY): Payer: Self-pay | Admitting: Family Medicine

## 2023-11-07 DIAGNOSIS — Z1231 Encounter for screening mammogram for malignant neoplasm of breast: Secondary | ICD-10-CM

## 2023-11-09 NOTE — Progress Notes (Unsigned)
 Referring Provider: Roxene Cora, PA* Primary Care Physician:  Roxene Cora, PA-C Primary GI Physician: Dr. Mordechai April  Chief Complaint  Patient presents with   Follow-up    States that everything is still the same.    HPI:   Ashley Meza is a 78 y.o. female with history of GERD, hiatal hernia, adenomatous colon polyps due for surveillance in 2027, chronic intermittent diarrhea with eating out, presenting today for follow-up.  Last seen in the office 04/27/2023.  GERD symptoms improved after changing omeprazole  40 mg to pantoprazole  40 mg daily.  Had occasional mild breakthrough if consuming known dietary triggers.  No other GI concerns.  Recommended continuing current medications, can take Pepcid as needed for GERD breakthrough, and follow up in 6 months.   Today:  Doing well overall.  GERD remains well-controlled with pantoprazole  40 mg daily.  Occasional breakthrough if eating something that she noticeable trigger symptoms.  Occasional use of Pepcid for breakthrough, but not routine.  No nausea, vomiting, dysphagia.  Bowels move daily to every other day. Diarrhea every couple of weeks.  This is chronic.  No brbpr or melena.    She has lost some weight.  States it is because she does not eat much during the day as she has too much to do and likes to stay busy.  If eating, she will get tired.  Eats a very light breakfast, does not eat lunch, then eats dinner.  Works out in the yard and does other physical activity during the day.  Wt Readings from Last 3 Encounters:  11/10/23 116 lb (52.6 kg)  04/27/23 117 lb (53.1 kg)  10/07/22 121 lb (54.9 kg)      Past Medical History:  Diagnosis Date   GERD (gastroesophageal reflux disease)    HLD (hyperlipidemia)    Hypothyroidism     Past Surgical History:  Procedure Laterality Date   BIOPSY  07/05/2019   Procedure: BIOPSY;  Surgeon: Alyce Jubilee, MD;  [facility]: [facility];  Service: Endoscopy;;   BREAST  LUMPECTOMY Left 07/05/1968   COLONOSCOPY N/A 10/24/2015   Surgeon: Alyce Jubilee, MD; diverticulosis, nonbleeding internal hemorrhoids, tubular adenoma.  Repeat in 5-10 years.   COLONOSCOPY WITH PROPOFOL  N/A 06/05/2021   Surgeon: Goble Last K, DO; non-bleeding internal hemorrhoids, 5 mm tubular adenoma, sigmoid diverticulosis. Recommended repeat in 5 years.   ESOPHAGOGASTRODUODENOSCOPY N/A 07/05/2019   Surgeon: Alyce Jubilee, MD; no obvious source for dyspepsia, small hiatal hernia, mild gastritis.  Biopsies with mild chronic gastritis with reactive changes.   POLYPECTOMY  06/05/2021   Procedure: POLYPECTOMY;  Surgeon: Vinetta Greening, DO;  Location: [facility];  Service: Endoscopy;;    Current Outpatient Medications  Medication Sig Dispense Refill   Calcium Carbonate-Vitamin D (CALCIUM-VITAMIN D3 PO) Take 1 tablet by mouth daily.     Cyanocobalamin (VITAMIN B 12) 500 MCG TABS Take by mouth.     EUTHYROX 88 MCG tablet Take 88 mcg by mouth daily before breakfast.     pantoprazole  (PROTONIX ) 40 MG tablet TAKE 1 TABLET BY MOUTH ONCE DAILY BEFORE BREAKFAST 30 tablet 11   potassium chloride (KLOR-CON) 8 MEQ tablet Take 8 mEq by mouth daily.     rosuvastatin (CRESTOR) 5 MG tablet Take 5 mg by mouth daily.     No current facility-administered medications for this visit.    Allergies as of 11/10/2023 - Review Complete 11/10/2023  Allergen Reaction Noted   Percocet [oxycodone-acetaminophen] Itching and Rash  09/17/2015    Family History  Problem Relation Age of Onset   Hypertension Mother    Barrett's esophagus Son    Colon cancer Neg Hx    Gastric cancer Neg Hx    Esophageal cancer Neg Hx     Social History   Socioeconomic History   Marital status: Widowed    Spouse name: Not on file   Number of children: Not on file   Years of education: Not on file   Highest education level: Not on file  Occupational History   Not on file  Tobacco Use   Smoking status: Former     Types: Cigarettes   Smokeless tobacco: Never   Tobacco comments:    Quit around 2010  Substance and Sexual Activity   Alcohol use: Yes    Comment: glass of wine every 6 months   Drug use: No   Sexual activity: Not on file  Other Topics Concern   Not on file  Social History Narrative   Not on file   Social Drivers of Health   Financial Resource Strain: Not on file  Food Insecurity: Not on file  Transportation Needs: Not on file  Physical Activity: Not on file  Stress: Not on file  Social Connections: Not on file    Review of Systems: Gen: Denies fever, chills, cold or flu like symptoms, pre-syncope, or syncope.  CV: Denies chest pain, palpitations. Resp: Denies dyspnea, cough.  GI: See HPI Heme: See HPI  Physical Exam: BP 120/67 (BP Location: Right Arm, Patient Position: Sitting, Cuff Size: Normal)   Pulse (!) 59   Temp 98 F (36.7 C) (Oral)   Ht 5\' 4"  (1.626 m)   Wt 116 lb (52.6 kg)   SpO2 95%   BMI 19.91 kg/m  General:   Alert and oriented. No distress noted. Pleasant and cooperative.  Head:  Normocephalic and atraumatic. Eyes:  Conjuctiva clear without scleral icterus. Heart:  S1, S2 present without murmurs appreciated. Lungs:  Clear to auscultation bilaterally. No wheezes, rales, or rhonchi. No distress.  Abdomen:  +BS, soft, non-tender and non-distended. No rebound or guarding. No HSM or masses noted. Msk:  Symmetrical without gross deformities. Normal posture. Extremities:  Without edema. Neurologic:  Alert and  oriented x4 Psych:  Normal mood and affect.    Assessment:  GERD: Fairly well-controlled on pantoprazole  40 mg daily.  Occasional breakthrough bleeding on dietary trigger.  Uses Pepcid as needed.  Weight loss: Documented 5 pound weight loss over the last year.  Likely secondary to low calorie intake and fairly high activity level during the day.  No significant GI symptoms.  Colonoscopy up-to-date.   Plan:  Continue pantoprazole  40 mg  daily. Continue Pepcid as needed Add daily protein shakes or other nutritional shake twice daily. Recommended Boost Breeze as this was non-dairy vs other non-dairy source due to diarrhea concerns with dairy.  Requested patient let me know if she loses additional 5 lbs.  Follow-up in 6 months.    Shana Daring, PA-C [facility] 11/10/2023

## 2023-11-10 ENCOUNTER — Encounter: Payer: Self-pay | Admitting: Gastroenterology

## 2023-11-10 ENCOUNTER — Ambulatory Visit: Admitting: Gastroenterology

## 2023-11-10 VITALS — BP 120/67 | HR 59 | Temp 98.0°F | Ht 64.0 in | Wt 116.0 lb

## 2023-11-10 DIAGNOSIS — K219 Gastro-esophageal reflux disease without esophagitis: Secondary | ICD-10-CM

## 2023-11-10 DIAGNOSIS — R634 Abnormal weight loss: Secondary | ICD-10-CM

## 2023-11-10 NOTE — Patient Instructions (Signed)
 Continue pantoprazole  40 mg daily.   Continue pepcid as needed for breakthrough heartburn.   I recommend adding 2 nutritional supplements/protein shakes daily.  Boost Breeze are non-dairy.   I will plan to see you back in 6 months, but please let me know if you loose an additional 5 lbs or more.   Shana Daring, PA-C [facility]

## 2023-11-14 ENCOUNTER — Ambulatory Visit (HOSPITAL_COMMUNITY)
Admission: RE | Admit: 2023-11-14 | Discharge: 2023-11-14 | Disposition: A | Discharge status: Home / Self Care | Source: Ambulatory Visit | Attending: Family Medicine | Admitting: Family Medicine

## 2023-11-14 DIAGNOSIS — Z1231 Encounter for screening mammogram for malignant neoplasm of breast: Secondary | ICD-10-CM | POA: Insufficient documentation

## 2023-12-21 DIAGNOSIS — H6592 Unspecified nonsuppurative otitis media, left ear: Secondary | ICD-10-CM | POA: Diagnosis not present

## 2023-12-21 DIAGNOSIS — Z681 Body mass index (BMI) 19 or less, adult: Secondary | ICD-10-CM | POA: Diagnosis not present

## 2023-12-27 ENCOUNTER — Ambulatory Visit
Admission: EM | Admit: 2023-12-27 | Discharge: 2023-12-27 | Disposition: A | Discharge status: Home / Self Care | Attending: Nurse Practitioner | Admitting: Nurse Practitioner

## 2023-12-27 DIAGNOSIS — H66002 Acute suppurative otitis media without spontaneous rupture of ear drum, left ear: Secondary | ICD-10-CM | POA: Diagnosis not present

## 2023-12-27 MED ORDER — AMOXICILLIN-POT CLAVULANATE 875-125 MG PO TABS: 1.0000 | ORAL_TABLET | Freq: Two times a day (BID) | ORAL | 0 refills | Status: AC

## 2023-12-27 NOTE — ED Provider Notes (Signed)
 [facility]    CSN: [id number] Arrival date & time: 12/27/23  1511      History   Chief Complaint No chief complaint on file.   HPI Ashley Meza is a 78 y.o. female.   Patient presents today with a few weeks of ear pain.  Thinks that she may have injured her face while working in the garden but denies any known injury.  Reports she saw her primary care provider about a week ago who looked in her ear, diagnosed her with a ear infection, and started her on amoxicillin.  She reports no improvement with the amoxicillin.  She still having left ear pain, trouble hearing, and left upper jaw pain.  She also endorses feeling flushed today but denies any fever, body aches, or chills.  She has been able to eat and drink okay.  Does not use Q-tips on a regular basis.  Denies ear drainage.    Past Medical History:  Diagnosis Date   GERD (gastroesophageal reflux disease)    HLD (hyperlipidemia)    Hypothyroidism     Patient Active Problem List   Diagnosis Date Noted   History of adenomatous polyp of colon 11/24/2020   Loss of weight 11/24/2020   Gastrointestinal food sensitivity 05/15/2020   Hiatal hernia 11/13/2019   Gastroesophageal reflux disease 04/26/2019   Special screening for malignant neoplasms, colon     Past Surgical History:  Procedure Laterality Date   BIOPSY  07/05/2019   Procedure: BIOPSY;  Surgeon: Harvey Margo CROME, MD;  [facility]: [facility];  Service: Endoscopy;;   BREAST LUMPECTOMY Left 07/05/1968   COLONOSCOPY N/A 10/24/2015   Surgeon: Margo CROME Harvey, MD; diverticulosis, nonbleeding internal hemorrhoids, tubular adenoma.  Repeat in 5-10 years.   COLONOSCOPY WITH PROPOFOL  N/A 06/05/2021   Surgeon: Cindie Dunnings K, DO; non-bleeding internal hemorrhoids, 5 mm tubular adenoma, sigmoid diverticulosis. Recommended repeat in 5 years.   ESOPHAGOGASTRODUODENOSCOPY N/A 07/05/2019   Surgeon: Harvey Margo CROME, MD; no obvious source for dyspepsia, small  hiatal hernia, mild gastritis.  Biopsies with mild chronic gastritis with reactive changes.   POLYPECTOMY  06/05/2021   Procedure: POLYPECTOMY;  Surgeon: Cindie Dunnings POUR, DO;  Location: [facility];  Service: Endoscopy;;    OB History   No obstetric history on file.      Home Medications    Prior to Admission medications   Medication Sig Start Date End Date Taking? Authorizing Provider  amoxicillin-clavulanate (AUGMENTIN) 875-125 MG tablet Take 1 tablet by mouth 2 (two) times daily for 7 days. 12/27/23 01/03/24 Yes Chandra Harlene LABOR, NP  Calcium Carbonate-Vitamin D (CALCIUM-VITAMIN D3 PO) Take 1 tablet by mouth daily.    [provider]  Cyanocobalamin (VITAMIN B 12) 500 MCG TABS Take by mouth.    [provider]  EUTHYROX 88 MCG tablet Take 88 mcg by mouth daily before breakfast. 04/04/19   [provider]  pantoprazole  (PROTONIX ) 40 MG tablet TAKE 1 TABLET BY MOUTH ONCE DAILY BEFORE BREAKFAST 08/01/23   Rudy Josette RAMAN, PA-C  potassium chloride (KLOR-CON) 8 MEQ tablet Take 8 mEq by mouth daily.    [provider]  rosuvastatin (CRESTOR) 5 MG tablet Take 5 mg by mouth daily.    [provider]    Family History Family History  Problem Relation Age of Onset   Hypertension Mother    Barrett's esophagus Son    Colon cancer Neg Hx    Gastric cancer Neg Hx    Esophageal  cancer Neg Hx     Social History Social History   Tobacco Use   Smoking status: Former    Types: Cigarettes   Smokeless tobacco: Never   Tobacco comments:    Quit around 2010  Substance Use Topics   Alcohol use: Yes    Comment: glass of wine every 6 months   Drug use: No     Allergies   Percocet [oxycodone-acetaminophen]   Review of Systems Review of Systems Per HPI  Physical Exam Triage Vital Signs ED Triage Vitals  Encounter Vitals Group     BP 12/27/23 1523 (!) 175/91     Girls Systolic BP Percentile --      Girls Diastolic BP Percentile --       Boys Systolic BP Percentile --      Boys Diastolic BP Percentile --      Pulse Rate 12/27/23 1523 (!) 103     Resp 12/27/23 1523 16     Temp 12/27/23 1523 98.2 F (36.8 C)     Temp Source 12/27/23 1523 Oral     SpO2 12/27/23 1523 96 %     Weight --      Height --      Head Circumference --      Peak Flow --      Pain Score 12/27/23 1524 0     Pain Loc --      Pain Education --      Exclude from Growth Chart --    No data found.  Updated Vital Signs BP (!) 175/91 (BP Location: Right Arm)   Pulse (!) 103   Temp 98.2 F (36.8 C) (Oral)   Resp 16   SpO2 96%   BP recheck: 151/83  Visual Acuity Right Eye Distance:   Left Eye Distance:   Bilateral Distance:    Right Eye Near:   Left Eye Near:    Bilateral Near:     Physical Exam Vitals and nursing note reviewed.  Constitutional:      General: She is not in acute distress.    Appearance: Normal appearance. She is not toxic-appearing.  HENT:     Head: Normocephalic and atraumatic.     Right Ear: Tympanic membrane, ear canal and external ear normal. There is no impacted cerumen.     Left Ear: A middle ear effusion is present. Tympanic membrane is erythematous.     Nose: No congestion or rhinorrhea.     Mouth/Throat:     Mouth: Mucous membranes are moist.     Dentition: Abnormal dentition. Dental caries present.     Pharynx: Oropharynx is clear.     Comments: Tenderness to left upper gumline  Cardiovascular:     Rate and Rhythm: Tachycardia present.  Pulmonary:     Effort: Pulmonary effort is normal. No respiratory distress.   Musculoskeletal:     Cervical back: Normal range of motion.  Lymphadenopathy:     Cervical: No cervical adenopathy.   Skin:    General: Skin is warm and dry.     Capillary Refill: Capillary refill takes less than 2 seconds.     Coloration: Skin is not jaundiced or pale.     Findings: No erythema.   Neurological:     Mental Status: She is alert and oriented to person, place, and  time.   Psychiatric:        Behavior: Behavior is cooperative.      UC Treatments / Results  Labs (all labs  ordered are listed, but only abnormal results are displayed) Labs Reviewed - No data to display  EKG   Radiology No results found.  Procedures Procedures (including critical care time)  Medications Ordered in UC Medications - No data to display  Initial Impression / Assessment and Plan / UC Course  I have reviewed the triage vital signs and the nursing notes.  Pertinent labs & imaging results that were available during my care of the patient were reviewed by me and considered in my medical decision making (see chart for details).   Patient is mildly hypertensive and tachycardic, hypertension improves somewhat on recheck.  Otherwise, vital signs are stable.  1. Non-recurrent acute suppurative otitis media of left ear without spontaneous rupture of tympanic membrane Patient reports no improvement with amoxicillin, will switch to Augmentin-encouraged to take full course Recommended close follow-up with dentist if left upper jaw pain does not improve with treatment Return and ER precautions also discussed  The patient was given the opportunity to ask questions.  All questions answered to their satisfaction.  The patient is in agreement to this plan.   Final Clinical Impressions(s) / UC Diagnoses   Final diagnoses:  Non-recurrent acute suppurative otitis media of left ear without spontaneous rupture of tympanic membrane     Discharge Instructions      Take the Augmentin as prescribed to treat the ear infection.  Follow up with Dentist if dental pain continues despite treatment.   ED Prescriptions     Medication Sig Dispense Auth. Provider   amoxicillin-clavulanate (AUGMENTIN) 875-125 MG tablet Take 1 tablet by mouth 2 (two) times daily for 7 days. 14 tablet Chandra Harlene LABOR, NP      PDMP not reviewed this encounter.   Chandra Harlene LABOR, NP 12/27/23  [phone]

## 2023-12-27 NOTE — Discharge Instructions (Signed)
 Take the Augmentin as prescribed to treat the ear infection.  Follow up with Dentist if dental pain continues despite treatment.

## 2023-12-27 NOTE — ED Triage Notes (Signed)
 Pt reports facial pain and swelling, was seen last week for an infection in the ear and was given amoxicillin in office at her primary care. But sx's have not gotten any better.

## 2024-01-20 ENCOUNTER — Encounter: Payer: Self-pay | Admitting: Advanced Practice Midwife

## 2024-05-01 DIAGNOSIS — K5730 Diverticulosis of large intestine without perforation or abscess without bleeding: Secondary | ICD-10-CM | POA: Diagnosis not present

## 2024-05-01 DIAGNOSIS — Z23 Encounter for immunization: Secondary | ICD-10-CM | POA: Diagnosis not present

## 2024-05-01 DIAGNOSIS — K219 Gastro-esophageal reflux disease without esophagitis: Secondary | ICD-10-CM | POA: Diagnosis not present

## 2024-05-01 DIAGNOSIS — E039 Hypothyroidism, unspecified: Secondary | ICD-10-CM | POA: Diagnosis not present

## 2024-05-01 DIAGNOSIS — Z0000 Encounter for general adult medical examination without abnormal findings: Secondary | ICD-10-CM | POA: Diagnosis not present

## 2024-05-01 DIAGNOSIS — R7309 Other abnormal glucose: Secondary | ICD-10-CM | POA: Diagnosis not present

## 2024-05-01 DIAGNOSIS — E785 Hyperlipidemia, unspecified: Secondary | ICD-10-CM | POA: Diagnosis not present

## 2024-05-14 ENCOUNTER — Ambulatory Visit: Admitting: Gastroenterology

## 2024-05-14 ENCOUNTER — Encounter: Payer: Self-pay | Admitting: Gastroenterology

## 2024-05-14 VITALS — BP 136/80 | HR 59 | Temp 98.3°F | Ht 64.0 in | Wt 119.0 lb

## 2024-05-14 DIAGNOSIS — K219 Gastro-esophageal reflux disease without esophagitis: Secondary | ICD-10-CM | POA: Diagnosis not present

## 2024-05-14 NOTE — Progress Notes (Signed)
 GI Office Note    Referring Provider: Leonce Lucie PARAS, PA* Primary Care Physician:  Leonce Lucie PARAS DEVONNA  Primary Gastroenterologist: Carlin POUR. Cindie, DO   Chief Complaint   Chief Complaint  Patient presents with   Follow-up    Follow up. Having some issues with acid reflux sometimes     History of Present Illness   ARACELLI WOLOSZYN is a 78 y.o. female presenting today for follow-up of GERD, hiatal hernia, adenomatous colon polyps due for surveillance in 2027.  Last seen in May 2025.  Discussed the use of AI scribe software for clinical note transcription with the patient, who gave verbal consent to proceed.  History of Present Illness   MILLIE FORDE is a 78 year old female with gastroesophageal reflux disease (GERD) who presents with ongoing reflux symptoms.  Her reflux symptoms have been fluctuating since her last visit approximately six months ago. She experiences periods of significant reflux, characterized by a persistent acid feeling in her mouth, throat, and nose, followed by days without symptoms. Despite attempts to monitor her diet to avoid triggers, symptoms recur unpredictably.  Reflux can occur at any time of the day, both in the morning and evening, but lying down does not exacerbate her symptoms. She is currently taking pantoprazole , having been switched from omeprazole  previously. On days when symptoms are more severe, she uses Pepcid, although it is less effective than her prescription medication.   Her bowel movements are generally fine, though she occasionally experiences loose stools, particularly when eating out or consuming certain foods. She is mindful of her diet to avoid being caught in uncomfortable situations. Overall she does fairly well.   Her weight has remained stable over the past six months, with minor fluctuations of a few pounds up or down. No significant changes in her health otherwise.       Wt Readings from Last 3  Encounters:  05/14/24 119 lb (54 kg)  11/10/23 116 lb (52.6 kg)  04/27/23 117 lb (53.1 kg)   Prior Data   Colonoscopy December 2022: - Nonbleeding internal hemorrhoids - Single 5 mm polyp in the transverse colon removed, tubular adenoma - Sigmoid diverticulosis - Next colonoscopy in 5 years if benefits outweigh the risk  EGD December 2020: - Esophagus normal - Small hiatal hernia - Mild gastritis    Medications   Current Outpatient Medications  Medication Sig Dispense Refill   Calcium Carbonate-Vitamin D (CALCIUM-VITAMIN D3 PO) Take 1 tablet by mouth daily.     Cyanocobalamin (VITAMIN B 12) 500 MCG TABS Take by mouth.     EUTHYROX 88 MCG tablet Take 88 mcg by mouth daily before breakfast.     pantoprazole  (PROTONIX ) 40 MG tablet TAKE 1 TABLET BY MOUTH ONCE DAILY BEFORE BREAKFAST 30 tablet 11   potassium chloride (KLOR-CON) 8 MEQ tablet Take 8 mEq by mouth daily.     rosuvastatin (CRESTOR) 5 MG tablet Take 5 mg by mouth daily.     No current facility-administered medications for this visit.    Allergies   Allergies as of 05/14/2024 - Review Complete 05/14/2024  Allergen Reaction Noted   Percocet [oxycodone-acetaminophen] Itching and Rash 09/17/2015      Review of Systems   General: Negative for anorexia, weight loss, fever, chills, fatigue, weakness. ENT: Negative for hoarseness, difficulty swallowing , nasal congestion. CV: Negative for chest pain, angina, palpitations, dyspnea on exertion, peripheral edema.  Respiratory: Negative for dyspnea at rest, dyspnea on exertion, cough, sputum,  wheezing.  GI: See history of present illness. GU:  Negative for dysuria, hematuria, urinary incontinence, urinary frequency, nocturnal urination.  Endo: Negative for unusual weight change.     Physical Exam   BP 136/80 (BP Location: Right Arm, Patient Position: Sitting, Cuff Size: Normal)   Pulse (!) 59   Temp 98.3 F (36.8 C) (Temporal)   Ht 5' 4 (1.626 m)   Wt 119 lb (54 kg)    BMI 20.43 kg/m    General: Well-nourished, well-developed in no acute distress.  Eyes: No icterus. Mouth: Oropharyngeal mucosa moist and pink   Lungs: Clear to auscultation bilaterally.  Heart: Regular rate and rhythm, no murmurs rubs or gallops.  Abdomen: Bowel sounds are normal, nontender, nondistended, no hepatosplenomegaly or masses,  no abdominal bruits or hernia , no rebound or guarding.  Rectal: not performed Extremities: No lower extremity edema. No clubbing or deformities. Neuro: Alert and oriented x 4   Skin: Warm and dry, no jaundice.   Psych: Alert and cooperative, normal mood and affect.  Labs   None available  Imaging Studies   No results found.  Assessment/Plan:    Gastroesophageal reflux disease   Chronic gastroesophageal reflux disease with intermittent exacerbations. Symptoms include acid sensation in the mouth, throat, and nose, primarily during upright positions. Current management with pantoprazole  and occasional use of Pepcid provides partial relief. Discussed potential use of Reflux Gourmet Rescue, a natural product that creates a mechanical barrier to prevent regurgitation, as an adjunct to current treatment.   - Continue pantoprazole  as prescribed. - Use Pepcid as needed for symptom relief. - Consider Reflux Gourmet Rescue before meals and at bedtime, at least on days she has flare ups.  - Contact clinic for medication adjustments if symptoms worsen. -return ov in six months.    Sonny RAMAN. Ezzard, MHS, PA-C [facility]

## 2024-05-14 NOTE — Patient Instructions (Addendum)
 Continue pantoprazole  once daily before breakfast.   You can add Reflux Gourmet RESCUE to help with reflux/regurgitation. This is a natural Alginate therapy that creates a protective barrier to stop reflux and regurgitation. It is 100% all natural product. Comes in vanilla caramel flavor as well as mint. You take one teaspoon before meals and at bedtime. Use every day if you want to, but use especially on days you are having reflux/regurgitation or if you find a time of the day that tends to be worse for you you can take at those times. Find out more on tennisprofile.[email or web address]. You can buy at their website or [employer].   We will see you back in about six months but if your symptoms are not adequately controlled, please call and we will make adjustments by phone.
